# Patient Record
Sex: Female | Born: 1935 | Race: White | Hispanic: No | Marital: Married | Smoking: Former smoker
Health system: Southern US, Community
[De-identification: ages and names within clinical notes are randomized; demographics above are authoritative.]

## PROBLEM LIST (undated history)

## (undated) DIAGNOSIS — G459 Transient cerebral ischemic attack, unspecified: Secondary | ICD-10-CM

## (undated) DIAGNOSIS — R251 Tremor, unspecified: Secondary | ICD-10-CM

## (undated) HISTORY — DX: Transient cerebral ischemic attack, unspecified: G45.9

## (undated) HISTORY — DX: Tremor, unspecified: R25.1

## (undated) HISTORY — PX: BACK SURGERY: SHX140

---

## 1965-08-10 HISTORY — PX: APPENDECTOMY: SHX54

## 1969-08-10 HISTORY — PX: VESICOVAGINAL FISTULA CLOSURE W/ TAH: SUR271

## 1993-08-10 DIAGNOSIS — G459 Transient cerebral ischemic attack, unspecified: Secondary | ICD-10-CM

## 1993-08-10 HISTORY — DX: Transient cerebral ischemic attack, unspecified: G45.9

## 1997-12-14 ENCOUNTER — Other Ambulatory Visit: Admission: RE | Admit: 1997-12-14 | Discharge: 1997-12-14 | Payer: Self-pay | Admitting: Family Medicine

## 1997-12-21 ENCOUNTER — Other Ambulatory Visit: Admission: RE | Admit: 1997-12-21 | Discharge: 1997-12-21 | Payer: Self-pay | Admitting: Family Medicine

## 1998-10-23 ENCOUNTER — Ambulatory Visit (HOSPITAL_COMMUNITY): Admission: RE | Admit: 1998-10-23 | Discharge: 1998-10-23 | Payer: Self-pay | Admitting: Specialist

## 1999-07-07 ENCOUNTER — Ambulatory Visit (HOSPITAL_COMMUNITY): Admission: RE | Admit: 1999-07-07 | Discharge: 1999-07-07 | Payer: Self-pay | Admitting: Specialist

## 2000-10-27 ENCOUNTER — Other Ambulatory Visit: Admission: RE | Admit: 2000-10-27 | Discharge: 2000-10-27 | Payer: Self-pay | Admitting: Family Medicine

## 2001-07-12 ENCOUNTER — Emergency Department (HOSPITAL_COMMUNITY): Admission: EM | Admit: 2001-07-12 | Discharge: 2001-07-12 | Payer: Self-pay | Admitting: Emergency Medicine

## 2001-09-16 ENCOUNTER — Encounter: Admission: RE | Admit: 2001-09-16 | Discharge: 2001-09-16 | Payer: Self-pay | Admitting: Family Medicine

## 2001-09-16 ENCOUNTER — Encounter: Payer: Self-pay | Admitting: Family Medicine

## 2002-10-19 ENCOUNTER — Encounter: Admission: RE | Admit: 2002-10-19 | Discharge: 2002-10-19 | Payer: Self-pay | Admitting: Family Medicine

## 2002-10-19 ENCOUNTER — Encounter: Payer: Self-pay | Admitting: Family Medicine

## 2003-06-19 ENCOUNTER — Encounter: Admission: RE | Admit: 2003-06-19 | Discharge: 2003-06-19 | Payer: Self-pay | Admitting: Orthopedic Surgery

## 2003-06-20 ENCOUNTER — Ambulatory Visit (HOSPITAL_COMMUNITY): Admission: RE | Admit: 2003-06-20 | Discharge: 2003-06-20 | Payer: Self-pay | Admitting: Orthopedic Surgery

## 2003-06-20 ENCOUNTER — Ambulatory Visit (HOSPITAL_BASED_OUTPATIENT_CLINIC_OR_DEPARTMENT_OTHER): Admission: RE | Admit: 2003-06-20 | Discharge: 2003-06-20 | Payer: Self-pay | Admitting: Orthopedic Surgery

## 2003-10-20 ENCOUNTER — Encounter: Admission: RE | Admit: 2003-10-20 | Discharge: 2003-10-20 | Payer: Self-pay | Admitting: Family Medicine

## 2003-10-24 ENCOUNTER — Encounter: Admission: RE | Admit: 2003-10-24 | Discharge: 2003-10-24 | Payer: Self-pay | Admitting: Family Medicine

## 2004-07-18 ENCOUNTER — Ambulatory Visit: Payer: Self-pay | Admitting: Internal Medicine

## 2004-07-21 ENCOUNTER — Encounter: Admission: RE | Admit: 2004-07-21 | Discharge: 2004-07-21 | Payer: Self-pay | Admitting: Orthopedic Surgery

## 2004-07-23 ENCOUNTER — Ambulatory Visit (HOSPITAL_BASED_OUTPATIENT_CLINIC_OR_DEPARTMENT_OTHER): Admission: RE | Admit: 2004-07-23 | Discharge: 2004-07-23 | Payer: Self-pay | Admitting: Orthopedic Surgery

## 2004-09-11 ENCOUNTER — Ambulatory Visit: Payer: Self-pay | Admitting: Family Medicine

## 2004-10-08 ENCOUNTER — Ambulatory Visit: Payer: Self-pay | Admitting: Family Medicine

## 2004-10-09 ENCOUNTER — Ambulatory Visit: Payer: Self-pay | Admitting: Family Medicine

## 2005-06-01 ENCOUNTER — Ambulatory Visit: Payer: Self-pay | Admitting: Internal Medicine

## 2005-07-29 ENCOUNTER — Ambulatory Visit: Payer: Self-pay | Admitting: Family Medicine

## 2005-10-28 ENCOUNTER — Ambulatory Visit: Payer: Self-pay | Admitting: Family Medicine

## 2005-11-02 ENCOUNTER — Ambulatory Visit: Payer: Self-pay | Admitting: Internal Medicine

## 2005-11-11 ENCOUNTER — Ambulatory Visit: Payer: Self-pay | Admitting: Family Medicine

## 2006-05-04 ENCOUNTER — Ambulatory Visit: Payer: Self-pay | Admitting: Family Medicine

## 2006-05-20 ENCOUNTER — Ambulatory Visit: Payer: Self-pay | Admitting: Family Medicine

## 2006-05-20 LAB — CONVERTED CEMR LAB
Eosinophil percent: 1.4 % (ref 0.0–5.0)
Glucose, Bld: 92 mg/dL (ref 70–99)
HCT: 41.1 % (ref 36.0–46.0)
Hemoglobin: 13.8 g/dL (ref 12.0–15.0)
MCHC: 33.5 g/dL (ref 30.0–36.0)
MCV: 89.8 fL (ref 78.0–100.0)
Monocytes Absolute: 1.3 10*3/uL — ABNORMAL HIGH (ref 0.2–0.7)
Neutrophils Relative %: 73.3 % (ref 43.0–77.0)
RBC: 4.57 M/uL (ref 3.87–5.11)
RDW: 12.8 % (ref 11.5–14.6)
WBC: 13.1 10*3/uL — ABNORMAL HIGH (ref 4.5–10.5)

## 2007-08-16 ENCOUNTER — Ambulatory Visit: Payer: Self-pay | Admitting: Family Medicine

## 2007-08-16 DIAGNOSIS — R413 Other amnesia: Secondary | ICD-10-CM | POA: Insufficient documentation

## 2007-08-16 DIAGNOSIS — M949 Disorder of cartilage, unspecified: Secondary | ICD-10-CM

## 2007-08-16 DIAGNOSIS — E039 Hypothyroidism, unspecified: Secondary | ICD-10-CM | POA: Insufficient documentation

## 2007-08-16 DIAGNOSIS — R519 Headache, unspecified: Secondary | ICD-10-CM | POA: Insufficient documentation

## 2007-08-16 DIAGNOSIS — R51 Headache: Secondary | ICD-10-CM

## 2007-08-16 DIAGNOSIS — M899 Disorder of bone, unspecified: Secondary | ICD-10-CM | POA: Insufficient documentation

## 2007-08-25 ENCOUNTER — Telehealth: Payer: Self-pay | Admitting: Family Medicine

## 2007-08-25 ENCOUNTER — Ambulatory Visit: Payer: Self-pay | Admitting: Family Medicine

## 2007-08-29 ENCOUNTER — Encounter: Admission: RE | Admit: 2007-08-29 | Discharge: 2007-08-29 | Payer: Self-pay | Admitting: Family Medicine

## 2007-08-29 ENCOUNTER — Encounter: Payer: Self-pay | Admitting: Family Medicine

## 2007-09-02 ENCOUNTER — Encounter: Admission: RE | Admit: 2007-09-02 | Discharge: 2007-09-02 | Payer: Self-pay | Admitting: Family Medicine

## 2007-09-02 ENCOUNTER — Encounter: Payer: Self-pay | Admitting: Family Medicine

## 2007-09-13 ENCOUNTER — Telehealth: Payer: Self-pay | Admitting: Family Medicine

## 2007-09-19 ENCOUNTER — Ambulatory Visit: Payer: Self-pay | Admitting: Internal Medicine

## 2007-09-19 ENCOUNTER — Encounter: Payer: Self-pay | Admitting: Family Medicine

## 2007-09-28 ENCOUNTER — Encounter: Admission: RE | Admit: 2007-09-28 | Discharge: 2007-09-28 | Payer: Self-pay | Admitting: Neurology

## 2008-02-15 ENCOUNTER — Ambulatory Visit: Payer: Self-pay | Admitting: Family Medicine

## 2008-02-15 DIAGNOSIS — N951 Menopausal and female climacteric states: Secondary | ICD-10-CM | POA: Insufficient documentation

## 2008-02-15 DIAGNOSIS — I679 Cerebrovascular disease, unspecified: Secondary | ICD-10-CM

## 2008-03-14 ENCOUNTER — Ambulatory Visit: Payer: Self-pay | Admitting: Family Medicine

## 2008-04-26 ENCOUNTER — Telehealth: Payer: Self-pay | Admitting: Family Medicine

## 2008-05-04 ENCOUNTER — Telehealth: Payer: Self-pay | Admitting: Family Medicine

## 2008-05-08 ENCOUNTER — Ambulatory Visit: Payer: Self-pay | Admitting: Family Medicine

## 2008-08-01 ENCOUNTER — Telehealth: Payer: Self-pay | Admitting: Family Medicine

## 2008-10-25 ENCOUNTER — Ambulatory Visit: Payer: Self-pay | Admitting: Family Medicine

## 2008-10-25 ENCOUNTER — Encounter: Payer: Self-pay | Admitting: Family Medicine

## 2008-10-25 DIAGNOSIS — E785 Hyperlipidemia, unspecified: Secondary | ICD-10-CM | POA: Insufficient documentation

## 2008-10-25 LAB — CONVERTED CEMR LAB
Bilirubin Urine: NEGATIVE
Blood in Urine, dipstick: NEGATIVE
Glucose, Urine, Semiquant: NEGATIVE
Urobilinogen, UA: 0.2
WBC Urine, dipstick: NEGATIVE
pH: 5.5

## 2008-11-05 ENCOUNTER — Telehealth: Payer: Self-pay | Admitting: Family Medicine

## 2008-11-07 ENCOUNTER — Emergency Department (HOSPITAL_COMMUNITY): Admission: EM | Admit: 2008-11-07 | Discharge: 2008-11-07 | Payer: Self-pay | Admitting: Family Medicine

## 2008-11-07 LAB — CONVERTED CEMR LAB
ALT: 13 U/L
AST: 24 U/L
Albumin: 3.9 g/dL
Alkaline Phosphatase: 49 U/L
BUN: 23 mg/dL
Basophils Absolute: 0 K/uL
Basophils Relative: 0.6 %
Bilirubin, Direct: 0 mg/dL
CO2: 31 meq/L
Calcium: 8.9 mg/dL
Chloride: 105 meq/L
Cholesterol: 191 mg/dL
Creatinine, Ser: 1 mg/dL
Eosinophils Absolute: 0.1 K/uL
Eosinophils Relative: 2.3 %
GFR calc non Af Amer: 56.8 mL/min
Glucose, Bld: 93 mg/dL
HCT: 41.2 %
HDL: 77.9 mg/dL
Hemoglobin: 13.7 g/dL
LDL Cholesterol: 99 mg/dL
Lymphocytes Relative: 28.8 %
Lymphs Abs: 1.8 K/uL
MCHC: 33.3 g/dL
MCV: 89.9 fL
Monocytes Absolute: 0.7 K/uL
Monocytes Relative: 11.5 %
Neutro Abs: 3.8 K/uL
Neutrophils Relative %: 56.8 %
Platelets: 211 K/uL
Potassium: 3.7 meq/L
RBC: 4.59 M/uL
RDW: 12.9 %
Sodium: 143 meq/L
TSH: 1.5 u[IU]/mL
Total Bilirubin: 0.7 mg/dL
Total CHOL/HDL Ratio: 2
Total Protein: 6.9 g/dL
Triglycerides: 70 mg/dL
VLDL: 14 mg/dL
WBC: 6.4 10*3/microliter

## 2008-11-08 ENCOUNTER — Ambulatory Visit: Payer: Self-pay | Admitting: Family Medicine

## 2009-05-27 ENCOUNTER — Ambulatory Visit: Payer: Self-pay | Admitting: Internal Medicine

## 2009-05-27 DIAGNOSIS — IMO0002 Reserved for concepts with insufficient information to code with codable children: Secondary | ICD-10-CM | POA: Insufficient documentation

## 2009-06-20 ENCOUNTER — Telehealth: Payer: Self-pay | Admitting: Family Medicine

## 2009-07-16 ENCOUNTER — Ambulatory Visit: Payer: Self-pay | Admitting: Family Medicine

## 2009-09-10 ENCOUNTER — Telehealth: Payer: Self-pay | Admitting: Family Medicine

## 2009-09-12 ENCOUNTER — Ambulatory Visit: Payer: Self-pay | Admitting: Family Medicine

## 2009-10-29 ENCOUNTER — Ambulatory Visit: Payer: Self-pay | Admitting: Family Medicine

## 2009-10-29 DIAGNOSIS — T50995A Adverse effect of other drugs, medicaments and biological substances, initial encounter: Secondary | ICD-10-CM

## 2009-11-21 ENCOUNTER — Encounter: Payer: Self-pay | Admitting: Family Medicine

## 2009-11-21 ENCOUNTER — Ambulatory Visit: Payer: Self-pay | Admitting: Family Medicine

## 2009-12-03 ENCOUNTER — Encounter: Admission: RE | Admit: 2009-12-03 | Discharge: 2009-12-03 | Payer: Self-pay | Admitting: Family Medicine

## 2009-12-10 ENCOUNTER — Telehealth: Payer: Self-pay | Admitting: Family Medicine

## 2009-12-10 ENCOUNTER — Encounter: Payer: Self-pay | Admitting: Family Medicine

## 2010-01-03 ENCOUNTER — Telehealth: Payer: Self-pay | Admitting: Family Medicine

## 2010-06-10 ENCOUNTER — Ambulatory Visit: Payer: Self-pay | Admitting: Family Medicine

## 2010-06-18 ENCOUNTER — Ambulatory Visit: Payer: Self-pay | Admitting: Family Medicine

## 2010-06-18 DIAGNOSIS — B0229 Other postherpetic nervous system involvement: Secondary | ICD-10-CM

## 2010-07-02 ENCOUNTER — Ambulatory Visit: Payer: Self-pay | Admitting: Family Medicine

## 2010-07-10 ENCOUNTER — Ambulatory Visit: Payer: Self-pay | Admitting: Internal Medicine

## 2010-08-19 ENCOUNTER — Ambulatory Visit
Admission: RE | Admit: 2010-08-19 | Discharge: 2010-08-19 | Payer: Self-pay | Source: Home / Self Care | Attending: Family Medicine | Admitting: Family Medicine

## 2010-08-19 DIAGNOSIS — H3530 Unspecified macular degeneration: Secondary | ICD-10-CM | POA: Insufficient documentation

## 2010-09-07 LAB — CONVERTED CEMR LAB
ALT: 14 units/L (ref 0–35)
AST: 19 units/L (ref 0–37)
Alkaline Phosphatase: 65 units/L (ref 39–117)
BUN: 16 mg/dL (ref 6–23)
BUN: 18 mg/dL (ref 6–23)
Basophils Absolute: 0.1 10*3/uL (ref 0.0–0.1)
Basophils Relative: 0.1 % (ref 0.0–1.0)
Bilirubin Urine: NEGATIVE
Bilirubin, Direct: 0.1 mg/dL (ref 0.0–0.3)
Bilirubin, Direct: 0.1 mg/dL (ref 0.0–0.3)
Blood in Urine, dipstick: NEGATIVE
CO2: 29 meq/L (ref 19–32)
CO2: 33 meq/L — ABNORMAL HIGH (ref 19–32)
Calcium: 8.8 mg/dL (ref 8.4–10.5)
Chloride: 102 meq/L (ref 96–112)
Chloride: 104 meq/L (ref 96–112)
Cholesterol: 188 mg/dL (ref 0–200)
Creatinine, Ser: 0.8 mg/dL (ref 0.4–1.2)
Eosinophils Absolute: 0.1 10*3/uL (ref 0.0–0.7)
Eosinophils Relative: 1.2 % (ref 0.0–5.0)
GFR calc Af Amer: 89 mL/min
Glucose, Bld: 96 mg/dL (ref 70–99)
HDL: 69.3 mg/dL (ref >39.0)
Ketones, urine, test strip: NEGATIVE
Ketones, urine, test strip: NEGATIVE
LDL Cholesterol: 85 mg/dL (ref 0–99)
LDL Cholesterol: 96 mg/dL (ref 0–99)
Lymphocytes Relative: 22.9 % (ref 12.0–46.0)
MCHC: 33.2 g/dL (ref 30.0–36.0)
MCV: 90.5 fL (ref 78.0–100.0)
Monocytes Absolute: 0.5 10*3/uL (ref 0.1–1.0)
Monocytes Relative: 7.7 % (ref 3.0–11.0)
Neutro Abs: 5.8 10*3/uL (ref 1.4–7.7)
Neutrophils Relative %: 58.4 % (ref 43.0–77.0)
Nitrite: NEGATIVE
Platelets: 244 10*3/uL (ref 150.0–400.0)
Platelets: 284 10*3/uL (ref 150–400)
Protein, U semiquant: NEGATIVE
RBC: 4.53 M/uL (ref 3.87–5.11)
RDW: 13.9 % (ref 11.5–14.6)
Specific Gravity, Urine: 1.025
Total Bilirubin: 0.6 mg/dL (ref 0.3–1.2)
Total CHOL/HDL Ratio: 2.4
Total Protein: 6.2 g/dL (ref 6.0–8.3)
Triglycerides: 75 mg/dL (ref 0–149)
Triglycerides: 86 mg/dL (ref 0.0–149.0)
Urobilinogen, UA: 0.2
VLDL: 15 mg/dL (ref 0–40)
Vit D, 25-Hydroxy: 27 ng/mL — ABNORMAL LOW (ref 30–89)
WBC Urine, dipstick: NEGATIVE
WBC: 6.8 10*3/uL (ref 4.5–10.5)
WBC: 8.4 10*3/uL (ref 4.5–10.5)
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
## 2010-09-09 NOTE — Progress Notes (Signed)
Summary: REFILL REQUEST (Diazepam)  Phone Note Refill Request Message from:  Fax from Pharmacy on Jan 03, 2010 10:39 AM  Refills Requested: Medication #1:  DIAZEPAM 2 MG  TABS 1 by mouth three times a day  as needed   Notes: Qty-270 EA......[facility] [address] [place].    Initial call taken by: Debbra Riding,  Jan 03, 2010 10:40 AM    Prescriptions: DIAZEPAM 2 MG  TABS (DIAZEPAM) 1 by mouth three times a day  as needed  #270 x 1   Entered by:   Duard Brady LPN   Authorized by:   Judithann Sheen MD   Signed by:   Duard Brady LPN on 34/74/2595   Method used:   Historical   RxID:   [id number]
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
## 2010-09-09 NOTE — Letter (Signed)
Summary: Generic Letter  [facility]  [address] [place], [place] [address]   Phone: [phone]  Fax: [phone]    12/10/2009  Christina Peck [address] [place], [place]  [address]  Dear Ms. Galvan,   We have been unable to reach you via phone. Dr Scotty Court has reviewed your Bone Denisty report which is normal. Continue calcium with vitamin D twice a day.            Sincerely,   Dr Gwenyth Bender Stafford,MD

## 2010-09-09 NOTE — Miscellaneous (Signed)
Summary: BONE DENSITY  Clinical Lists Changes  Orders: Added new Test order of T-Bone Densitometry (77080) - Signed Added new Test order of T-Lumbar Vertebral Assessment (77082) - Signed 

## 2010-09-09 NOTE — Progress Notes (Signed)
Summary: Christina Peck  Phone Note Call from Patient   Summary of Call: The Vit D 50,000 units or 1.25mg  D2 causes constipation & something else she can't remember.  Then said to forget this.  What she really needs is help with Christina Peck.   Christina Peck went to heart specialist & he said stents okay.  She thinks needs to go to psychiatrist, he's going downhill, not eating, lost 10 lbs, always something, yesterday he siad his thumb was burning, & she thinks meds too muc or too many.  She needs Almira Coaster to call her back.  Wouldn't let me transfer her because she didn't want Christina Peck to hear and said he was coming right back into room.    DOB [birth year].   Almira Coaster called wife.  See message Margaretmary Eddy [birth year] Rudy Jew, RN  Dec 10, 2009 2:42 PM   Initial call taken by: Rudy Jew, RN,  Dec 10, 2009 11:45 AM
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
## 2010-09-09 NOTE — Progress Notes (Signed)
Summary: arm sore from shot & needs premarin & diazepam  Phone Note Call from Patient Call back at Home Phone [phone]   Caller: vm Call For: stafford Reason for Call: Talk to Nurse Summary of Call: 1)Arm still sore after about a month from tetanus shot when she lifts arm or touches it where the shot was given.  No redness or sore or anything visible.  No lump when she feels area.  She is using the arm plenty.  Nurse advised warm wet washcloth to area 4x daily for comfort or ov as needed.  She declined it today.  Has appointment Mar or will call back if still sore to get it checked.   2) Her meds are no longer mailorder.  They sent her a list that had premarin 0.625mg  once daily  on it to get at local store.  Diazepam 2mg  tid was not on it, but she needs it too.  [facility]. Initial call taken by: Rudy Jew, RN,  September 10, 2009 10:02 AM  Follow-up for Phone Call        called pt to come in, also took care of prescriptions
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
## 2010-09-20 ENCOUNTER — Other Ambulatory Visit: Payer: Self-pay | Admitting: Family Medicine

## 2010-09-20 DIAGNOSIS — F419 Anxiety disorder, unspecified: Secondary | ICD-10-CM

## 2010-09-25 ENCOUNTER — Other Ambulatory Visit: Payer: Self-pay | Admitting: *Deleted

## 2010-09-25 ENCOUNTER — Telehealth: Payer: Self-pay | Admitting: *Deleted

## 2010-09-25 MED ORDER — DIAZEPAM 5 MG PO TABS: 5.0000 mg | ORAL_TABLET | Freq: Three times a day (TID) | ORAL | Status: DC | PRN

## 2010-09-25 NOTE — Telephone Encounter (Signed)
ERROR in opening.

## 2010-09-25 NOTE — Telephone Encounter (Signed)
Pt calls stating that she usually gets Diazepam 2 mg. One po tid for 3 months, and not the Diazepam 5 mg. q 8 hours.  Please call her.

## 2010-09-26 ENCOUNTER — Telehealth: Payer: Self-pay | Admitting: Family Medicine

## 2010-09-26 NOTE — Telephone Encounter (Signed)
Changed to 2 mg diazepam

## 2010-09-26 NOTE — Telephone Encounter (Signed)
[facility] [address] and was given Diazepam 5 mg take 1 pill every 8 hrs anxiety or sleep. Pt says that dosage and instructions are wrong. It should be 2 mg 1 tab tid as needed for stress. Pls call pharmacy and corrected.

## 2010-09-29 ENCOUNTER — Other Ambulatory Visit: Payer: Self-pay | Admitting: *Deleted

## 2010-09-29 NOTE — Telephone Encounter (Signed)
rx changed

## 2010-10-07 ENCOUNTER — Encounter: Payer: Self-pay | Admitting: Internal Medicine

## 2010-10-07 ENCOUNTER — Ambulatory Visit (INDEPENDENT_AMBULATORY_CARE_PROVIDER_SITE_OTHER): Payer: Medicare Other | Admitting: Internal Medicine

## 2010-10-07 ENCOUNTER — Ambulatory Visit: Payer: Self-pay | Admitting: Internal Medicine

## 2010-10-07 DIAGNOSIS — J40 Bronchitis, not specified as acute or chronic: Secondary | ICD-10-CM | POA: Insufficient documentation

## 2010-10-07 MED ORDER — AZITHROMYCIN 250 MG PO TABS: ORAL_TABLET | ORAL | Status: AC

## 2010-10-07 NOTE — Assessment & Plan Note (Signed)
Recommend abx tx. Followup if no improvement or worsening.

## 2010-10-07 NOTE — Progress Notes (Signed)
  Subjective:    Patient ID: Christina Peck, female    DOB: 11-30-1926, 77 y.o.   MRN: [id number]  HPI  Pt presents to clinic for evaluation of cough. Notes 8 day h/o NP cough, recent fever with Tmax 101 and nasal congestion. No known sick exposure. Has attempted hydromet cough syrup without significant improvement. No other alleviating or exacerbating factors. +fatigue and malaise.   Reviewed PMH, medications and allergies    Review of Systems  Constitutional: Positive for fever, chills and fatigue.  HENT: Positive for congestion and sneezing. Negative for ear pain and facial swelling.   Eyes: Negative for pain, discharge and redness.  Respiratory: Positive for cough. Negative for shortness of breath.   Skin: Negative for color change, pallor and rash.       Objective:   Physical Exam  Nursing note and vitals reviewed. Constitutional: She appears well-developed and well-nourished. No distress.  HENT:  Head: Normocephalic and atraumatic.  Right Ear: Tympanic membrane, external ear and ear canal normal.  Left Ear: Tympanic membrane, external ear and ear canal normal.  Nose: Nose normal.  Mouth/Throat: Oropharynx is clear and moist. No oropharyngeal exudate.  Eyes: Conjunctivae are normal. No scleral icterus.  Neck: Neck supple.  Cardiovascular: Normal rate, regular rhythm and normal heart sounds.   Pulmonary/Chest: Effort normal and breath sounds normal. No respiratory distress. She has no wheezes. She has no rales.  Lymphadenopathy:    She has no cervical adenopathy.  Neurological: She is alert.  Skin: Skin is warm and dry. No rash noted. She is not diaphoretic. No erythema.          Assessment & Plan:

## 2010-10-13 ENCOUNTER — Telehealth: Payer: Self-pay | Admitting: *Deleted

## 2010-10-13 MED ORDER — AZITHROMYCIN 250 MG PO TABS: ORAL_TABLET | ORAL | Status: DC

## 2010-10-13 NOTE — Telephone Encounter (Signed)
Repeat zpak as directed #1 but followup if no signficant improvement or worsening

## 2010-10-13 NOTE — Telephone Encounter (Signed)
Notified pt and meds called to pharmacy.

## 2010-10-13 NOTE — Telephone Encounter (Signed)
Finished antibiotic Saturday, no appetite , extremely fatigued.  Lower  back pain. Coughing is better. Finished the cough meds, and the cough is back only when she talks.

## 2010-10-16 ENCOUNTER — Other Ambulatory Visit: Payer: Self-pay

## 2010-10-16 MED ORDER — AZITHROMYCIN 250 MG PO TABS: ORAL_TABLET | ORAL | Status: AC

## 2010-10-16 MED ORDER — AZITHROMYCIN 250 MG PO TABS: ORAL_TABLET | ORAL | Status: DC

## 2010-10-28 ENCOUNTER — Telehealth: Payer: Self-pay | Admitting: Family Medicine

## 2010-10-28 NOTE — Telephone Encounter (Signed)
Pt is sadly req to changed pcp from Dr Scotty Court to Dr Rodena Medin, due to availablity issues. Pt is req to sch a cpx with Dr Rodena Medin in March. Pls let pt know if it is ok to change doctors.

## 2010-10-29 NOTE — Telephone Encounter (Signed)
ok 

## 2010-10-30 NOTE — Telephone Encounter (Signed)
Pt is req to change pcp to see Dr Rodena Medin. Dr Rodena Medin has accepted req. Pls advise.

## 2010-11-06 NOTE — Telephone Encounter (Signed)
OK to cange

## 2010-11-12 ENCOUNTER — Encounter: Payer: Self-pay | Admitting: Family Medicine

## 2010-11-12 ENCOUNTER — Ambulatory Visit (INDEPENDENT_AMBULATORY_CARE_PROVIDER_SITE_OTHER): Payer: Medicare Other | Admitting: Family Medicine

## 2010-11-12 VITALS — BP 98/68 | Temp 98.1°F

## 2010-11-12 DIAGNOSIS — R05 Cough: Secondary | ICD-10-CM

## 2010-11-12 DIAGNOSIS — R059 Cough, unspecified: Secondary | ICD-10-CM

## 2010-11-12 MED ORDER — HYDROCODONE-HOMATROPINE 5-1.5 MG/5ML PO SYRP: 5.0000 mL | ORAL_SOLUTION | Freq: Four times a day (QID) | ORAL | Status: AC | PRN

## 2010-11-12 NOTE — Progress Notes (Signed)
  Subjective:    Patient ID: Christina Peck, female    DOB: Jun 11, 1927, 77 y.o.   MRN: [id number]  HPI Patient seen with cough. Remote history of smoking many years ago. Recurrent cough for the past several months but each time has improved between episodes. Current cough 5 days duration. She reports temperature of 99.8 but no chills. Increased fatigue. Denies any dyspnea, hemoptysis, or pleuritic pain. Questionable remote history of TB nonpulmonary treated many years ago.  Hycodan cough syrup helps night cough but running out.   Review of Systems  Constitutional: Negative for activity change, appetite change and unexpected weight change.  Respiratory: Positive for cough. Negative for chest tightness, shortness of breath and wheezing.   Cardiovascular: Negative for chest pain and leg swelling.  Gastrointestinal: Negative for abdominal pain.  Neurological: Negative for syncope.  Hematological: Negative for adenopathy. Does not bruise/bleed easily.       Objective:   Physical Exam  Constitutional: She appears well-developed and well-nourished.  HENT:  Mouth/Throat: Oropharynx is clear and moist. No oropharyngeal exudate.  Neck: Neck supple.  Cardiovascular: Normal rate and regular rhythm.  Exam reveals no gallop.   Pulmonary/Chest: Effort normal and breath sounds normal. No respiratory distress. She has no wheezes. She has no rales.  Musculoskeletal: She exhibits no edema.  Lymphadenopathy:    She has no cervical adenopathy.  Skin: No rash noted.          Assessment & Plan:  Cough. She has no fever this time and lung exam unremarkable. Questionable viral bronchitis. Refilled Hycodan cough syrup for suppression. Followup when necessary if symptoms worsen or persist

## 2010-11-12 NOTE — Patient Instructions (Signed)
Follow up promptly for any increased fever, shortness of breath, coughing up blood, or any pain with deep breathing.

## 2010-11-19 ENCOUNTER — Telehealth: Payer: Self-pay | Admitting: Speech Pathology

## 2010-11-19 ENCOUNTER — Ambulatory Visit (INDEPENDENT_AMBULATORY_CARE_PROVIDER_SITE_OTHER)
Admission: RE | Admit: 2010-11-19 | Discharge: 2010-11-19 | Disposition: A | Discharge status: Home / Self Care | Payer: Medicare Other | Source: Ambulatory Visit | Attending: Internal Medicine | Admitting: Internal Medicine

## 2010-11-19 ENCOUNTER — Encounter: Payer: Self-pay | Admitting: Internal Medicine

## 2010-11-19 ENCOUNTER — Ambulatory Visit (INDEPENDENT_AMBULATORY_CARE_PROVIDER_SITE_OTHER): Payer: Medicare Other | Admitting: Internal Medicine

## 2010-11-19 DIAGNOSIS — R05 Cough: Secondary | ICD-10-CM

## 2010-11-19 DIAGNOSIS — R011 Cardiac murmur, unspecified: Secondary | ICD-10-CM

## 2010-11-19 DIAGNOSIS — E559 Vitamin D deficiency, unspecified: Secondary | ICD-10-CM

## 2010-11-19 DIAGNOSIS — R5383 Other fatigue: Secondary | ICD-10-CM

## 2010-11-19 DIAGNOSIS — R5381 Other malaise: Secondary | ICD-10-CM

## 2010-11-19 DIAGNOSIS — R059 Cough, unspecified: Secondary | ICD-10-CM

## 2010-11-19 LAB — HEPATIC FUNCTION PANEL
ALT: 12 U/L (ref 0–35)
Albumin: 3.4 g/dL — ABNORMAL LOW (ref 3.5–5.2)
Alkaline Phosphatase: 72 U/L (ref 39–117)
Total Protein: 6.4 g/dL (ref 6.0–8.3)

## 2010-11-19 LAB — CBC WITH DIFFERENTIAL/PLATELET
Eosinophils Relative: 1.2 % (ref 0.0–5.0)
HCT: 38.7 % (ref 36.0–46.0)
Hemoglobin: 13.1 g/dL (ref 12.0–15.0)
Lymphs Abs: 1.8 10*3/uL (ref 0.7–4.0)
Monocytes Relative: 7.9 % (ref 3.0–12.0)
Neutro Abs: 5.6 10*3/uL (ref 1.4–7.7)
Platelets: 300 10*3/uL (ref 150.0–400.0)
WBC: 8.2 10*3/uL (ref 4.5–10.5)

## 2010-11-19 LAB — BASIC METABOLIC PANEL
CO2: 29 mEq/L (ref 19–32)
Calcium: 8.6 mg/dL (ref 8.4–10.5)
Chloride: 101 mEq/L (ref 96–112)
Glucose, Bld: 84 mg/dL (ref 70–99)
Sodium: 139 mEq/L (ref 135–145)

## 2010-11-19 LAB — TSH: TSH: 0.82 u[IU]/mL (ref 0.35–5.50)

## 2010-11-19 MED ORDER — LEVOFLOXACIN 500 MG PO TABS: 500.0000 mg | ORAL_TABLET | Freq: Every day | ORAL | Status: AC

## 2010-11-19 NOTE — Telephone Encounter (Signed)
Opened in error

## 2010-11-20 ENCOUNTER — Telehealth: Payer: Self-pay | Admitting: Family Medicine

## 2010-11-20 NOTE — Telephone Encounter (Signed)
Discussed results with husband.cxr and labs nl. Want to see how she does on this abx. If at end of abx she is not significantly better then will consider pulmonary referral. Would hold off on cpe and keep regular appts. This would not impact her ability to get cpe's in the future.

## 2010-11-20 NOTE — Telephone Encounter (Signed)
Pt is sch for fasting cpx on 11/25/10 and is wanting to know if she needs to keep this appt, or wait to see what the labs and chest xray reveals. Pt also wants to know if she doesn't come in for cpx, if that would have a negative effect on her yearly cpx for next year? Pls adivse.

## 2010-11-20 NOTE — Telephone Encounter (Signed)
Pt unavailable. Spoke with husband. He will give her the message again and have her call back if necessary

## 2010-11-23 ENCOUNTER — Encounter: Payer: Self-pay | Admitting: Internal Medicine

## 2010-11-23 DIAGNOSIS — E559 Vitamin D deficiency, unspecified: Secondary | ICD-10-CM | POA: Insufficient documentation

## 2010-11-23 DIAGNOSIS — R011 Cardiac murmur, unspecified: Secondary | ICD-10-CM | POA: Insufficient documentation

## 2010-11-23 DIAGNOSIS — R5383 Other fatigue: Secondary | ICD-10-CM | POA: Insufficient documentation

## 2010-11-23 DIAGNOSIS — R059 Cough, unspecified: Secondary | ICD-10-CM | POA: Insufficient documentation

## 2010-11-23 DIAGNOSIS — R05 Cough: Secondary | ICD-10-CM | POA: Insufficient documentation

## 2010-11-23 NOTE — Assessment & Plan Note (Signed)
?   Diastolic murmur on exam. Schedule 2d echo.

## 2010-11-23 NOTE — Assessment & Plan Note (Signed)
Persistent cough. Reattempt abx course. Obtain CXR. If sx persist consider pulmonary consult

## 2010-11-23 NOTE — Assessment & Plan Note (Signed)
Obtain vitamin d level 

## 2010-11-23 NOTE — Progress Notes (Signed)
  Subjective:    Patient ID: Christina Peck, female    DOB: 01-Mar-1927, 78 y.o.   MRN: [id number]  HPI Pt presents to clinic for evaluation of fatigue. Notes chronic fatigue recently worse with associated NP cough. Had fevers 4/5-4/8 with temperatures 101-102 now resolved. + weakness, decreased appetite. Mucinex helps cough. 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 Chart review indicates h/o anemia and vit d deficiency in the past as well. No alleviating or exacerbating factors.  Reviewed pmh, medications and allergies.    Review of Systems  Constitutional: Positive for fever, appetite change and fatigue. Negative for chills.  HENT: Negative for neck stiffness.   Respiratory: Positive for cough. Negative for shortness of breath and wheezing.   Cardiovascular: Negative for chest pain.  Neurological: Negative for seizures and facial asymmetry.  Psychiatric/Behavioral: Negative for confusion and agitation.       Objective:   Physical Exam  Nursing note and vitals reviewed. Constitutional: She appears well-developed and well-nourished. No distress.  HENT:  Head: Normocephalic.  Right Ear: External ear normal.  Left Ear: External ear normal.  Eyes: Conjunctivae are normal. No scleral icterus.  Neck: Neck supple. No JVD present. No thyromegaly present.  Cardiovascular: Normal rate and regular rhythm.  Exam reveals no gallop and no friction rub.   Murmur heard.      ?diastolic murmur  Pulmonary/Chest: Effort normal and breath sounds normal. No respiratory distress. She has no wheezes. She has no rales.  Lymphadenopathy:    She has no cervical adenopathy.  Neurological: She is alert.  Skin: Skin is warm and dry. She is not diaphoretic.  Psychiatric: She has a normal mood and affect.          Assessment & Plan:

## 2010-11-23 NOTE — Assessment & Plan Note (Signed)
Obtain cbc, chem7, lft, esr, b12, tsh

## 2010-11-25 ENCOUNTER — Encounter: Payer: Medicare Other | Admitting: Internal Medicine

## 2010-11-27 ENCOUNTER — Ambulatory Visit (HOSPITAL_COMMUNITY): Payer: Medicare Other | Attending: Internal Medicine

## 2010-11-27 DIAGNOSIS — R5383 Other fatigue: Secondary | ICD-10-CM | POA: Insufficient documentation

## 2010-11-27 DIAGNOSIS — I079 Rheumatic tricuspid valve disease, unspecified: Secondary | ICD-10-CM | POA: Insufficient documentation

## 2010-11-27 DIAGNOSIS — I059 Rheumatic mitral valve disease, unspecified: Secondary | ICD-10-CM | POA: Insufficient documentation

## 2010-11-27 DIAGNOSIS — R011 Cardiac murmur, unspecified: Secondary | ICD-10-CM | POA: Insufficient documentation

## 2010-11-27 DIAGNOSIS — R5381 Other malaise: Secondary | ICD-10-CM | POA: Insufficient documentation

## 2010-12-01 ENCOUNTER — Encounter: Payer: Self-pay | Admitting: Internal Medicine

## 2010-12-01 ENCOUNTER — Ambulatory Visit (INDEPENDENT_AMBULATORY_CARE_PROVIDER_SITE_OTHER): Payer: Medicare Other | Admitting: Internal Medicine

## 2010-12-01 VITALS — BP 120/50 | HR 80 | Wt 111.0 lb

## 2010-12-01 DIAGNOSIS — R05 Cough: Secondary | ICD-10-CM

## 2010-12-01 DIAGNOSIS — R053 Chronic cough: Secondary | ICD-10-CM

## 2010-12-01 DIAGNOSIS — R059 Cough, unspecified: Secondary | ICD-10-CM

## 2010-12-04 ENCOUNTER — Encounter: Payer: Self-pay | Admitting: Internal Medicine

## 2010-12-04 ENCOUNTER — Ambulatory Visit (INDEPENDENT_AMBULATORY_CARE_PROVIDER_SITE_OTHER): Payer: Medicare Other | Admitting: Internal Medicine

## 2010-12-04 VITALS — BP 126/72 | HR 62 | Temp 97.9°F | Ht 60.6 in | Wt 111.4 lb

## 2010-12-04 DIAGNOSIS — R059 Cough, unspecified: Secondary | ICD-10-CM

## 2010-12-04 DIAGNOSIS — R05 Cough: Secondary | ICD-10-CM

## 2010-12-04 NOTE — Patient Instructions (Addendum)
As long as you are coughing at all regardless of the cause start prilosec 20mg  Take 30-60 min before first meal of the day  And Pepcid 20 mg at bedtime (remember that reflux is to cough what oxygen is to fire).  GERD (REFLUX)  is an extremely common cause of respiratory symptoms, many times with no significant heartburn at all.    It can be treated with medication, but also with lifestyle changes including avoidance of late meals, excessive alcohol, smoking cessation, and avoid fatty foods, chocolate, peppermint, colas, red wine, and acidic juices such as orange juice.  NO MINT OR MENTHOL PRODUCTS SO NO COUGH DROPS  USE SUGARLESS CANDY INSTEAD (jolley ranchers or Stover's)  NO OIL BASED VITAMINS    Unlike when you get a prescription for eyeglasses, it's not possible to always walk out of this or any medical office with a perfect prescription that is immediately effective  based on any test that we offer here.    On the contrary, it may take several weeks for the full impact of changes recommened today - hopefully you will respond well.  If not, then we'll adjust your medication on your next visit accordingly, knowing more then than we can possibly know now.      If not 100% satisfied after 4 weeks, return here.  If all better, tell your friends!

## 2010-12-04 NOTE — Assessment & Plan Note (Addendum)
The most common causes of chronic cough in immunocompetent adults include the following: upper airway cough syndrome (UACS), previously referred to as postnasal drip syndrome (PNDS), which is caused by variety of rhinosinus conditions; (2) asthma; (3) GERD; (4) chronic bronchitis from cigarette smoking or other inhaled environmental irritants; (5) nonasthmatic eosinophilic bronchitis; and (6) bronchiectasis.   These conditions, singly or in combination, have accounted for up to 94% of the causes of chronic cough in prospective studies.   Other conditions have constituted no >6% of the causes in prospective studies These have included bronchogenic carcinoma, chronic interstitial pneumonia, sarcoidosis, left ventricular failure, ACEI-induced cough, and aspiration from a condition associated with pharyngeal dysfunction.   .Chronic cough is often simultaneously caused by more than one condition. A single cause has been found from 38 to 82% of the time, multiple causes from 18 to 62%. Multiply caused cough has been the result of three diseases up to 42% of the time.     This most likely  Upper airway cough syndrome, so named because it's frequently impossible to sort out how much is  CR/sinusitis with freq throat clearing (which can be related to primary GERD)   vs  causing  secondary (" extra esophageal")  GERD from wide swings in gastric pressure that occur with throat clearing, often  promoting self use of mint and menthol lozenges that reduce the lower esophageal sphincter tone and exacerbate the problem further in a cyclical fashion.   These are the same pts who not infrequently have failed to tolerate ace inhibitors,  dry powder inhalers or biphosphonates or report having reflux symptoms that don't respond to standard doses of PPI , and are easily confused as having aecopd or asthma flares,   See instructions for specific recommendations which were reviewed directly with the patient who was given a  copy with highlighter outlining the key components.
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
## 2010-12-07 ENCOUNTER — Encounter: Payer: Self-pay | Admitting: Internal Medicine

## 2010-12-07 DIAGNOSIS — R053 Chronic cough: Secondary | ICD-10-CM | POA: Insufficient documentation

## 2010-12-07 DIAGNOSIS — R05 Cough: Secondary | ICD-10-CM | POA: Insufficient documentation

## 2010-12-07 NOTE — Assessment & Plan Note (Signed)
Nonspecific symptoms with normal evaluation to date. However chronic cough remains persistent. Multiple chemical inhalation exposures in the past with associated mild pulmonary hypertension on echo. Proceed with pulmonary consult
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
## 2010-12-15 ENCOUNTER — Ambulatory Visit (INDEPENDENT_AMBULATORY_CARE_PROVIDER_SITE_OTHER): Payer: Medicare Other | Admitting: Internal Medicine

## 2010-12-15 ENCOUNTER — Encounter: Payer: Self-pay | Admitting: Internal Medicine

## 2010-12-15 DIAGNOSIS — R5381 Other malaise: Secondary | ICD-10-CM

## 2010-12-15 DIAGNOSIS — R5383 Other fatigue: Secondary | ICD-10-CM

## 2010-12-15 DIAGNOSIS — R05 Cough: Secondary | ICD-10-CM

## 2010-12-15 DIAGNOSIS — R059 Cough, unspecified: Secondary | ICD-10-CM

## 2010-12-15 NOTE — Assessment & Plan Note (Signed)
Resolved

## 2010-12-15 NOTE — Progress Notes (Signed)
  Subjective:    Patient ID: Christina Peck, female    DOB: 1927-06-07, 76 y.o.   MRN: [id number]  HPI Pt presents to clinic for followup of fatigue and chronic cough. Chronic cough has resolved without further treatment. Was evaluated by pulmonary who recommended PPI and H2 blocker but patient did not take. Patient also notes chronic history of resting tremor involving hands and legs. Specifically notes worsening with stress. When questioned about possible stress and anxiety notes multiple stressors at home including taking care of her husband and her grandson. Does feel that stress may be contributing to her physical symptoms. Chronically for years but is not interested in adjusting her medication currently. Also discussed yearly mammograms and patient currently declines. Is aware of the recommendation for yearly mammography for breast cancer screening but states if diagnosed would not be interested in treatment. No other complaints  Reviewed past medical history, medications and allergies.  Review of Systems  Constitutional: Positive for fatigue. Negative for fever and chills.  HENT: Negative for congestion.   Respiratory: Negative for cough, shortness of breath and wheezing.   Neurological: Positive for tremors.  Psychiatric/Behavioral: The patient is nervous/anxious.        Objective:   Physical Exam  [vitalsreviewed. Constitutional: She appears well-developed and well-nourished. No distress.  HENT:  Head: Normocephalic and atraumatic.  Eyes: Conjunctivae are normal. No scleral icterus.  Neurological: She is alert.       Bilateral resting fine hand tremor. Symmetric. Visibly worsened after discussing home stressors.  Skin: Skin is warm and dry. She is not diaphoretic.  Psychiatric: She has a normal mood and affect.          Assessment & Plan:

## 2010-12-15 NOTE — Assessment & Plan Note (Signed)
Workup negative to date suspect possible contribution from home stressors. Patient declines adjustment of medication. Schedule routine followup.

## 2010-12-26 NOTE — Op Note (Signed)
[place]. [facility]  Patient:    Christina Peck                         MRN: [id number] Proc. Date: 07/07/99 Adm. Date:  [id number] Attending:  Mick Sell                           Operative Report  SURGEON:  Chucky May, M.D.  PREOPERATIVE DIAGNOSIS:  Cataract, O.S.  POSTOPERATIVE DIAGNOSIS:  Cataract, O.S.  OPERATION PERFORMED:  Cataract extraction with intraocular lens implant, O.S.  INDICATIONS FOR PROCEDURE:  The patient is a 77 year old female with painless and progressive decrease in vision.  She says that she has difficulty seeing for reading.  On examination, she was found to have a dense nuclear and posterior subcapsular cataract consistent with a decrease in visual acuity.  DESCRIPTION OF PROCEDURE:  The patient was brought to the main operating room and placed in the supine position.  Anesthesia was obtained by a means of topical 4% lidocaine drops with tetracaine.  She was then prepped and draped in the usual manner.  A lid speculum was inserted, and the cornea was entered with a diamond  keratome superiorly with an additional port superior temporally in clear cornea  with a 15 degree blade.  OcuCoat was instilled, and an anterior capsulorrhexis as performed without difficulty, followed by thecal emulsification of the nucleus nd removal of residual cortical material by irrigation and aspiration.  The posterior capsule was polished, and a posterior chamber lens implant was placed in the bag without difficulty.  The OcuCoat was removed and replaced with balanced salt solution.  The wound was hydrated with a balanced salt solution and checked for  fluid leaks, and none were noted.  The eye was dressed with topical Pred Forte,  Ocuflox, Voltaren, and a Fox shield, and the patient was taken to the recovery oom in excellent condition where she received written and verbal instructions for postoperative care and was scheduled  for a followup in 24 hours. DD:  07/07/99 TD:  07/07/99 Job: [id number]

## 2010-12-26 NOTE — Op Note (Signed)
NAME:  Christina Peck, Christina Peck                           ACCOUNT NO.:  [id number]   MEDICAL RECORD NO.:  [id number]                   PATIENT TYPE:  AMB   LOCATION:  DSC                                  FACILITY:  [facility]   PHYSICIAN:  Matthew A. Mina Marble, M.D.           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   OPERATION PERFORMED:  Release of right elbow and right wrist ulnar nerve.   SURGEON:  Artist Pais. Mina Marble, M.D.   ASSISTANT:  Aura Fey. Bobbe Medico.   ANESTHESIA:  General.   TOURNIQUET TIME:  40 minutes.   COMPLICATIONS:  None.   DRAINS:  None.   DESCRIPTION OF PROCEDURE:  The patient was taken to the operating room.  After the induction of adequate general anesthesia, the right upper  extremity was prepped and draped in the usual sterile fashion.  Esmarch  bandage was used to exsanguinate the limb.  The tourniquet was inflated to  .  At this point a longitudinal incision was made over the palpable  border of the flexor carpi ulnaris tendon and then a Brunner incision was  crossed over the wrist.  Flaps were raised accordingly.  The ulnar nerve and  artery were identified proximal in the wound and decompressed to the level  of Guillain's canal and to the level of the take off of the superficial  branches of the ulnar nerve.  All compression points were released.  This  wound was thoroughly irrigated and then loosely closed with 5-0 nylon.  A  second incision was made on the inside of the elbow centered between the  medial epicondyle and the olecranon process going 2.5 cm in either  direction.  Incision was taken down through the skin and subcutaneous  tissues.  Care was taken to identify and protect  branches of the medial  antebrachial cutaneous nerve.  Once this was done, the ulnar nerve was  identified proximal to the cubital tunnel.  The cubital tunnel was released.  Ulnar nerve decompressed to the level of the flexor carpi ulnaris muscle  bellies and the fascia overlying this was released as well as proximally to  the level of the ligament of Struthers.  The medial intermuscular septum was  excised.  The wound was thoroughly irrigated.  Hemostasis was achieved with  bipolar cautery and this was closed with running 3-0 Prolene subcuticular  stitch.  Steri-Strips, 4 x 4s, fluffs and compressive dressings were applied  to both the wrist and the elbow.  The patient tolerated the procedure well  and went to recovery room in stable fashion.  Artist Pais Mina Marble, M.D.    MAW/MEDQ  D:  06/20/2003  T:  06/20/2003  Job:  [id number]

## 2010-12-26 NOTE — Op Note (Signed)
NAME:  Christina Peck, Christina Peck               ACCOUNT NO.:  [id number]   MEDICAL RECORD NO.:  [id number]          PATIENT TYPE:  AMB   LOCATION:  DSC                          FACILITY:  [facility]   PHYSICIAN:  Matthew A. Weingold, M.D.DATE OF BIRTH:  June 27, 1927   DATE OF PROCEDURE:  07/23/2004  DATE OF DISCHARGE:                                 OPERATIVE REPORT   PREOPERATIVE DIAGNOSIS:  Right elbow recurrent ulnar compression neuropathy.   POSTOPERATIVE DIAGNOSIS:  Right elbow recurrent ulnar compression  neuropathy.   PROCEDURE:  Revision ulnar neuroplasty right elbow with intramuscular  transposition of the ulnar nerve.   SURGEON:  Artist Pais. Mina Marble, M.D.   ASSISTANT:  Aura Fey. Bobbe Medico.   ANESTHESIA:  General.   TOURNIQUET TIME:  50 minutes.   COMPLICATIONS:  None.   DRAINS:  None.   DESCRIPTION OF PROCEDURE:  The patient was taken to the operating room with  the induction of adequate general anesthesia.  Right upper extremity was  prepped and draped in the usual sterile fashion.  An Esmarch was used to  exsanguinate the limb.  The tourniquet was inflated to 250 mmHg.  At this  point in time, an incision was made on the medial aspect of the right elbow  centered between the olecranon process and the medial epicondyle using her  old incision but extended proximally and distally for another 3 cm, totaling  8 cm in length.  The incision was taken down through the skin and  subcutaneous tissues.  Care was undertaken to identify and preserve branches  of the medial antebrachial cutaneous nerve.  Once this was done, the ulnar  nerve was identified in the cubital tunnel.  The cubital tunnel was  released.  The ulnar nerve was traced down and decompressed to the level of  the flexor carpi ulnaris fascia and proximally to the level 6-8 cm above the  medial epicondyle.  The scarred intramuscular septum in the medial aspect  was excised.  At this point in time, the nerve was carefully  transposed with  preservation of large articular branches and the branches of the flexor  ulnaris muscle anteriorly.  A trough was outlined in the flexor pronator  mass.  The nerve was then placed back into the groove and a trough was made  in the flexor pronator mass using a 15 blade.  All fibrous bands were  excised.  The nerve was then transposed into this groove and secured with  the wrist flexed and the forearm pronated using 2-0 Vicryl to close the  fascia, thus creating an intramuscular transposition.  There were no  significant pressure points.  It was stable in the transposed position with  gliding both of flexion and extension.  The wound was then thoroughly  irrigated, was loosely closed in layers of 2-0 Vicryl and a  running 3-0 Prolene subcuticular stitch.  Steri-Strips, 4x4's, fluffs, and a  dressing was applied with the elbow in 9 degrees of flexion, the forearm  pronated, and the wrist flexed.  The patient tolerated the procedure well,  went to recovery in stable fashion.  Matt   MAW/MEDQ  D:  07/23/2004  T:  07/23/2004  Job:  [id number]

## 2011-02-20 ENCOUNTER — Telehealth: Payer: Self-pay | Admitting: *Deleted

## 2011-02-20 NOTE — Telephone Encounter (Signed)
Pt sees Dr Rodena Medin and does not want to transfer to [facility] with him.  She would like to see Dr Fabian Sharp.  Received a letter a mail that she could pick a dr

## 2011-02-23 ENCOUNTER — Telehealth: Payer: Self-pay | Admitting: *Deleted

## 2011-02-23 NOTE — Telephone Encounter (Signed)
Not taking new medicare to me  at this time.

## 2011-02-23 NOTE — Telephone Encounter (Signed)
error 

## 2011-02-24 NOTE — Telephone Encounter (Signed)
OK 

## 2011-02-24 NOTE — Telephone Encounter (Signed)
Pt would like to see Dr Kirtland Bouchard if she can

## 2011-02-25 NOTE — Telephone Encounter (Signed)
Pt.notified

## 2011-03-30 ENCOUNTER — Other Ambulatory Visit: Payer: Self-pay

## 2011-03-30 DIAGNOSIS — F419 Anxiety disorder, unspecified: Secondary | ICD-10-CM

## 2011-03-30 MED ORDER — DIAZEPAM 5 MG PO TABS: 5.0000 mg | ORAL_TABLET | Freq: Three times a day (TID) | ORAL | Status: DC | PRN

## 2011-03-30 NOTE — Telephone Encounter (Signed)
Faxed back to [facility] 

## 2011-05-20 ENCOUNTER — Ambulatory Visit: Payer: Medicare Other | Admitting: Internal Medicine

## 2011-05-21 ENCOUNTER — Ambulatory Visit (INDEPENDENT_AMBULATORY_CARE_PROVIDER_SITE_OTHER): Payer: Medicare Other | Admitting: Internal Medicine

## 2011-05-21 ENCOUNTER — Encounter: Payer: Self-pay | Admitting: Internal Medicine

## 2011-05-21 DIAGNOSIS — N951 Menopausal and female climacteric states: Secondary | ICD-10-CM

## 2011-05-21 DIAGNOSIS — E039 Hypothyroidism, unspecified: Secondary | ICD-10-CM

## 2011-05-21 DIAGNOSIS — R109 Unspecified abdominal pain: Secondary | ICD-10-CM

## 2011-05-21 NOTE — Progress Notes (Signed)
  Subjective:    Patient ID: Christina Peck, female    DOB: 1927/08/08, 77 y.o.   MRN: [id number]  HPI  77 year old patient who presents with a chief complaint of lower mid abdominal pain. She states this has been present for approximately 2 months. There's been no documented weight loss. Denies any nausea vomiting or diarrhea. She does state that approximately one month ago she noted a single episode of bright rectal bleeding. A few days ago she also noticed some blood on her undergarments that she felt was probably vaginal and not rectal. She has had a remote hysterectomy in 1972. She has refused prior colonoscopies. Laboratory studies were done in the spring and were unremarkable. She does have a history of low back pain. She states that her present lower mid abdominal pain radiates to the back. Pain has worsened over the past 2 months she also describes nocturnal pain and often awakens her at 4 to 5 in the morning;  denies any fever or chills    Review of Systems  Constitutional: Negative for fever, appetite change, fatigue and unexpected weight change.  HENT: Negative for hearing loss, ear pain, nosebleeds, congestion, sore throat, mouth sores, trouble swallowing, neck stiffness, dental problem, voice change, sinus pressure and tinnitus.   Eyes: Negative for photophobia, pain, redness and visual disturbance.  Respiratory: Negative for cough, chest tightness and shortness of breath.   Cardiovascular: Negative for chest pain, palpitations and leg swelling.  Gastrointestinal: Positive for abdominal pain and blood in stool. Negative for nausea, vomiting, diarrhea, constipation, abdominal distention and rectal pain.  Genitourinary: Negative for dysuria, urgency, frequency, hematuria, flank pain, vaginal bleeding, vaginal discharge, difficulty urinating, genital sores, vaginal pain, menstrual problem and pelvic pain.  Musculoskeletal: Negative for back pain and arthralgias.  Skin: Negative for rash.    Neurological: Negative for dizziness, syncope, speech difficulty, weakness, light-headedness, numbness and headaches.  Hematological: Negative for adenopathy. Does not bruise/bleed easily.  Psychiatric/Behavioral: Negative for suicidal ideas, behavioral problems, self-injury, dysphoric mood and agitation. The patient is not nervous/anxious.        Objective:   Physical Exam  Constitutional: She appears well-developed and well-nourished. No distress.  Abdominal: Soft. Bowel sounds are normal. She exhibits no distension and no mass. There is no tenderness. There is no rebound and no guarding.       Surgical scar right lower quadrant and transverse suprapubic scar No mass or organomegaly. No guarding tenderness  Genitourinary: Guaiac negative stool.       Status post hysterectomy Atrophic changes only          Assessment & Plan:   Abdominal pain unclear etiology Atrophic vaginitis  We'll check screening labs and stool for occult blood. Will recheck in 2 weeks. We'll consider a colonoscopy and CT abdominal scan if symptoms persist

## 2011-05-21 NOTE — Progress Notes (Signed)
  Subjective:    Patient ID: Christina Peck, female    DOB: July 09, 1927, 77 y.o.   MRN: [id number]  HPI  Wt Readings from Last 3 Encounters:  05/21/11 110 lb (49.896 kg)  12/15/10 110 lb (49.896 kg)  12/04/10 111 lb 6.4 oz (50.531 kg)     Review of Systems     Objective:   Physical Exam        Assessment & Plan:

## 2011-05-21 NOTE — Patient Instructions (Signed)
Return office visit in 2 weeks  Call or return to clinic prn if these symptoms worsen or fail to improve as anticipated.

## 2011-05-22 ENCOUNTER — Other Ambulatory Visit: Payer: Self-pay | Admitting: *Deleted

## 2011-05-22 DIAGNOSIS — F419 Anxiety disorder, unspecified: Secondary | ICD-10-CM

## 2011-05-22 LAB — COMPREHENSIVE METABOLIC PANEL
AST: 22 U/L (ref 0–37)
Albumin: 3.9 g/dL (ref 3.5–5.2)
BUN: 22 mg/dL (ref 6–23)
CO2: 31 mEq/L (ref 19–32)
Calcium: 9 mg/dL (ref 8.4–10.5)
Chloride: 103 mEq/L (ref 96–112)
Potassium: 4.5 mEq/L (ref 3.5–5.1)

## 2011-05-22 LAB — CBC WITH DIFFERENTIAL/PLATELET
Basophils Absolute: 0.1 10*3/uL (ref 0.0–0.1)
Basophils Relative: 2 % (ref 0.0–3.0)
Eosinophils Absolute: 0.3 10*3/uL (ref 0.0–0.7)
Lymphocytes Relative: 26.2 % (ref 12.0–46.0)
MCHC: 33.1 g/dL (ref 30.0–36.0)
Neutrophils Relative %: 57.5 % (ref 43.0–77.0)
RBC: 4.37 Mil/uL (ref 3.87–5.11)
RDW: 13.9 % (ref 11.5–14.6)

## 2011-05-22 MED ORDER — ASPIRIN 81 MG PO TABS: 81.0000 mg | ORAL_TABLET | Freq: Every day | ORAL | Status: AC

## 2011-05-22 MED ORDER — DIAZEPAM 5 MG PO TABS: 2.0000 mg | ORAL_TABLET | Freq: Three times a day (TID) | ORAL | Status: DC | PRN

## 2011-05-22 NOTE — Telephone Encounter (Signed)
Pt called stating her after visit list of meds had error.  Corrected per Pt.

## 2011-05-26 ENCOUNTER — Other Ambulatory Visit (INDEPENDENT_AMBULATORY_CARE_PROVIDER_SITE_OTHER): Payer: Medicare Other

## 2011-05-26 DIAGNOSIS — K921 Melena: Secondary | ICD-10-CM

## 2011-05-26 DIAGNOSIS — IMO0001 Reserved for inherently not codable concepts without codable children: Secondary | ICD-10-CM

## 2011-05-26 LAB — HEMOCCULT GUIAC POC 1CARD (OFFICE): Card #2 Fecal Occult Blod, POC: NEGATIVE

## 2011-06-04 ENCOUNTER — Encounter: Payer: Self-pay | Admitting: Internal Medicine

## 2011-06-04 ENCOUNTER — Ambulatory Visit (INDEPENDENT_AMBULATORY_CARE_PROVIDER_SITE_OTHER): Payer: Medicare Other | Admitting: Internal Medicine

## 2011-06-04 DIAGNOSIS — R05 Cough: Secondary | ICD-10-CM

## 2011-06-04 DIAGNOSIS — R109 Unspecified abdominal pain: Secondary | ICD-10-CM

## 2011-06-04 DIAGNOSIS — G8929 Other chronic pain: Secondary | ICD-10-CM

## 2011-06-04 DIAGNOSIS — R059 Cough, unspecified: Secondary | ICD-10-CM

## 2011-06-04 DIAGNOSIS — N951 Menopausal and female climacteric states: Secondary | ICD-10-CM

## 2011-06-04 NOTE — Progress Notes (Signed)
  Subjective:    Patient ID: Christina Peck, female    DOB: April 01, 1927, 77 y.o.   MRN: [id number]  HPI 77 year old patient who is seen today for followup. She was seen 2 weeks ago complaining of lower abdominal pain. Clinical exam at that time was fairly unremarkable except for atrophic vaginitis. She did return a number of stool specimens all negative for occult blood. She complains of pain mainly in the lower mid abdominal area with some radiation to the back area. She has some chronic constipation issues that are managed fairly well. There has been some concern about weight loss but this has not been documented. Laboratory screening has been unremarkable;  she has declined a colonoscopy.  Weight: 110 lb (49.896 kg)     Review of Systems  Constitutional: Negative.   HENT: Negative for hearing loss, congestion, sore throat, rhinorrhea, dental problem, sinus pressure and tinnitus.   Eyes: Negative for pain, discharge and visual disturbance.  Respiratory: Positive for cough. Negative for shortness of breath.   Cardiovascular: Negative for chest pain, palpitations and leg swelling.  Gastrointestinal: Positive for abdominal pain. Negative for nausea, vomiting, diarrhea, constipation, blood in stool and abdominal distention.  Genitourinary: Negative for dysuria, urgency, frequency, hematuria, flank pain, vaginal bleeding, vaginal discharge, difficulty urinating, vaginal pain and pelvic pain.  Musculoskeletal: Negative for joint swelling, arthralgias and gait problem.  Skin: Negative for rash.  Neurological: Negative for dizziness, syncope, speech difficulty, weakness, numbness and headaches.  Hematological: Negative for adenopathy.  Psychiatric/Behavioral: Negative for behavioral problems, dysphoric mood and agitation. The patient is not nervous/anxious.        Objective:   Physical Exam  Constitutional: She is oriented to person, place, and time. She appears well-developed and well-nourished.  No distress.  HENT:  Head: Normocephalic.  Right Ear: External ear normal.  Left Ear: External ear normal.  Mouth/Throat: Oropharynx is clear and moist.  Eyes: Conjunctivae and EOM are normal. Pupils are equal, round, and reactive to light.  Neck: Normal range of motion. Neck supple. No thyromegaly present.  Cardiovascular: Normal rate, regular rhythm, normal heart sounds and intact distal pulses.   Pulmonary/Chest: Effort normal and breath sounds normal.  Abdominal: Soft. Bowel sounds are normal. She exhibits no distension and no mass. There is no tenderness. There is no rebound and no guarding.       Surgical scar right lower abdomen. Unremarkable exam  Musculoskeletal: Normal range of motion.  Lymphadenopathy:    She has no cervical adenopathy.  Neurological: She is alert and oriented to person, place, and time.  Skin: Skin is warm and dry. No rash noted.  Psychiatric: She has a normal mood and affect. Her behavior is normal.          Assessment & Plan:   Chronic abdominal pain. We'll proceed with abdominal and pelvic CT scan Chronic cough History of dyslipidemia

## 2011-06-04 NOTE — Patient Instructions (Signed)
CT abdomen and pelvis as discussed  Call or return to clinic prn if these symptoms worsen or fail to improve as anticipated.

## 2011-06-09 ENCOUNTER — Ambulatory Visit (INDEPENDENT_AMBULATORY_CARE_PROVIDER_SITE_OTHER)
Admission: RE | Admit: 2011-06-09 | Discharge: 2011-06-09 | Disposition: A | Discharge status: Home / Self Care | Payer: Medicare Other | Source: Ambulatory Visit | Attending: Internal Medicine | Admitting: Internal Medicine

## 2011-06-09 DIAGNOSIS — R109 Unspecified abdominal pain: Secondary | ICD-10-CM

## 2011-06-09 MED ORDER — IOHEXOL 300 MG/ML  SOLN
80.0000 mL | Freq: Once | INTRAMUSCULAR | Status: AC | PRN
  Administered 2011-06-09: 80 mL via INTRAVENOUS

## 2011-06-10 NOTE — Progress Notes (Signed)
Quick Note:  Given to dr. Tawanna Cooler for review ______

## 2011-06-10 NOTE — Progress Notes (Signed)
Quick Note:  Spoke with pt - informed dr. Amador Cunas out of office until next wed. - dr. Tawanna Cooler reviewed and there is no clear expliantion for pain - recommeneded GI referral , pt declined at this time - has been pain free for last several days - will let us know if pain returens and wants GI appt. KIK ______

## 2011-06-16 ENCOUNTER — Other Ambulatory Visit: Payer: Self-pay | Admitting: Internal Medicine

## 2011-07-10 ENCOUNTER — Telehealth: Payer: Self-pay | Admitting: Family Medicine

## 2011-07-16 NOTE — Telephone Encounter (Signed)
Error

## 2011-08-10 ENCOUNTER — Telehealth: Payer: Self-pay | Admitting: Family Medicine

## 2011-08-10 NOTE — Telephone Encounter (Signed)
Refill request for Hydromet syrup, pt last here on 06/04/11.

## 2011-08-12 NOTE — Telephone Encounter (Signed)
This is a Dr. Kwiatkowski patient  

## 2011-08-13 MED ORDER — HYDROCODONE-HOMATROPINE 5-1.5 MG/5ML PO SYRP: 5.0000 mL | ORAL_SOLUTION | Freq: Three times a day (TID) | ORAL | Status: AC | PRN

## 2011-08-13 NOTE — Telephone Encounter (Signed)
Addended by: Duard Brady I on: 08/13/2011 09:11 AM   Modules accepted: Orders

## 2011-08-13 NOTE — Telephone Encounter (Signed)
Last seen 06/04/11  Last written 11/2010  \please advise

## 2011-08-13 NOTE — Telephone Encounter (Signed)
6 oz 

## 2011-10-06 ENCOUNTER — Telehealth: Payer: Self-pay | Admitting: Internal Medicine

## 2011-10-06 NOTE — Telephone Encounter (Signed)
Pt called and said that her insurance is changing on 11/08/11 and pt would like to know if she needs to sch a cpx or not. Pt said that her husband is coming in for cpx on 10/09/11. Pt said that she came in for ov to see Dr Amador Cunas last year and believes that he did lab work then. Would this count toward cpx?

## 2011-10-06 NOTE — Telephone Encounter (Signed)
The last cpx i see in chart was 2011 with dr. Scotty Court -labs were done 11/2010 by dr. Rodena Medin but he saw her for fatigue - saw dr. Amador Cunas later on for abd pain and rov- yes she needs to schedule cpx Please assist in making appt if desired by pt.

## 2011-10-08 NOTE — Telephone Encounter (Signed)
Pt is req to come in for fasting cpx in March 2013 at around 10:30 or 11 am. Is it ok to work in sch some time in month?

## 2011-10-08 NOTE — Telephone Encounter (Signed)
Only in the AM - not a WED or a Friday

## 2011-10-09 ENCOUNTER — Other Ambulatory Visit: Payer: Self-pay

## 2011-10-09 DIAGNOSIS — F419 Anxiety disorder, unspecified: Secondary | ICD-10-CM

## 2011-10-09 MED ORDER — DIAZEPAM 5 MG PO TABS: 2.0000 mg | ORAL_TABLET | Freq: Three times a day (TID) | ORAL | Status: DC | PRN

## 2011-10-09 NOTE — Telephone Encounter (Signed)
Called pt and schd her for fasting cpx for Tuesday 10/13/11 at 10:30am as noted.

## 2011-10-13 ENCOUNTER — Encounter: Payer: Medicare Other | Admitting: Internal Medicine

## 2011-11-13 ENCOUNTER — Ambulatory Visit (INDEPENDENT_AMBULATORY_CARE_PROVIDER_SITE_OTHER): Payer: Medicare Other | Admitting: Internal Medicine

## 2011-11-13 ENCOUNTER — Encounter: Payer: Self-pay | Admitting: Internal Medicine

## 2011-11-13 VITALS — BP 114/70 | HR 71 | Temp 97.6°F | Resp 18 | Ht 60.0 in | Wt 109.0 lb

## 2011-11-13 DIAGNOSIS — E039 Hypothyroidism, unspecified: Secondary | ICD-10-CM

## 2011-11-13 DIAGNOSIS — I679 Cerebrovascular disease, unspecified: Secondary | ICD-10-CM | POA: Diagnosis not present

## 2011-11-13 DIAGNOSIS — E785 Hyperlipidemia, unspecified: Secondary | ICD-10-CM

## 2011-11-13 DIAGNOSIS — M949 Disorder of cartilage, unspecified: Secondary | ICD-10-CM

## 2011-11-13 DIAGNOSIS — M899 Disorder of bone, unspecified: Secondary | ICD-10-CM | POA: Diagnosis not present

## 2011-11-13 DIAGNOSIS — Z0000 Encounter for general adult medical examination without abnormal findings: Secondary | ICD-10-CM | POA: Diagnosis not present

## 2011-11-13 LAB — LIPID PANEL
HDL: 68.2 mg/dL (ref >39.00)
LDL Cholesterol: 101 mg/dL — ABNORMAL HIGH (ref 0–99)
Total CHOL/HDL Ratio: 3
VLDL: 18 mg/dL (ref 0.0–40.0)

## 2011-11-13 MED ORDER — ESTROGENS CONJUGATED 0.625 MG PO TABS: 0.6250 mg | ORAL_TABLET | Freq: Every day | ORAL | Status: DC

## 2011-11-13 MED ORDER — DIAZEPAM 2 MG PO TABS: 2.0000 mg | ORAL_TABLET | Freq: Three times a day (TID) | ORAL | Status: DC
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
## 2011-11-13 NOTE — Patient Instructions (Signed)
It is important that you exercise regularly, at least 20 minutes 3 to 4 times per week.  If you develop chest pain or shortness of breath seek  medical attention.  Take a calcium supplement, plus [phone] units of vitamin D  Take 81 mg of aspirin daily  Please see your eye doctor yearly to check for  eye damage  Return in one year for follow-up

## 2011-11-27 ENCOUNTER — Telehealth: Payer: Self-pay | Admitting: Internal Medicine

## 2011-11-27 NOTE — Telephone Encounter (Signed)
Pt requesting results of lab work. Please contact. °

## 2011-11-27 NOTE — Telephone Encounter (Signed)
Spoke with pt - informed labs normal - mailed copy per request

## 2011-12-21 ENCOUNTER — Telehealth: Payer: Self-pay | Admitting: Family Medicine

## 2011-12-21 MED ORDER — DIAZEPAM 2 MG PO TABS: 2.0000 mg | ORAL_TABLET | Freq: Three times a day (TID) | ORAL | Status: DC

## 2011-12-21 NOTE — Telephone Encounter (Signed)
Called in.

## 2011-12-21 NOTE — Telephone Encounter (Signed)
Pt called 8:59a.m. Pulled from Triage vmail. She needs refill on her Diazepam, however, due to insurance has to switch pharmacies. Please call in refill to [facility] on [address]. Thanks!

## 2011-12-23 ENCOUNTER — Telehealth: Payer: Self-pay | Admitting: *Deleted

## 2011-12-23 NOTE — Telephone Encounter (Signed)
Please call pt about her refill about her refills of Valium.  She is almost out and needs to get it ASAP at another pharmacy due to her insurance.

## 2011-12-23 NOTE — Telephone Encounter (Signed)
Spoke with [facility]- they have rx we called in on Monday ready for pick up  I called pt and made her aware

## 2012-01-27 ENCOUNTER — Telehealth: Payer: Self-pay | Admitting: Internal Medicine

## 2012-01-27 NOTE — Telephone Encounter (Signed)
Patient called stating that she need a refill of her premarin 0.625 90 day supply sent to [facility] on [address] [phone] and it requires prior authorization. Please assist.

## 2012-01-28 ENCOUNTER — Ambulatory Visit: Payer: Medicare Other | Admitting: Internal Medicine

## 2012-02-04 ENCOUNTER — Encounter: Payer: Self-pay | Admitting: Internal Medicine

## 2012-02-04 ENCOUNTER — Ambulatory Visit (INDEPENDENT_AMBULATORY_CARE_PROVIDER_SITE_OTHER): Payer: Medicare Other | Admitting: Internal Medicine

## 2012-02-04 VITALS — BP 114/70 | HR 64 | Temp 97.7°F | Wt 110.0 lb

## 2012-02-04 DIAGNOSIS — K089 Disorder of teeth and supporting structures, unspecified: Secondary | ICD-10-CM | POA: Diagnosis not present

## 2012-02-04 DIAGNOSIS — K0889 Other specified disorders of teeth and supporting structures: Secondary | ICD-10-CM

## 2012-02-04 NOTE — Patient Instructions (Signed)
Followup with general dentistry if unimproved next week Call if you develop a fever or any worsening pain

## 2012-02-04 NOTE — Progress Notes (Signed)
  Subjective:    Patient ID: Hall Busing, female    DOB: [birth year], 78 y.o.   MRN: [id number]  HPI  78 year old patient who underwent procedures at her general dentist 9 days ago. She received bilateral anesthetics apparently with epinephrine through place to old fillings. She had a difficult time during the procedure with considerable discomfort. She also began shaking vigorously probably from the effects of the epinephrine. The procedure was completed. She has had persistent pain involving both mandible areas the left greater than the right. Does not seem to be TMJ pain. She did contact her dentist who suggested ibuprofen. There's been no fever or other complaints include some mild pain involving the left facial area. Symptoms seem to be improving modestly    Review of Systems  Constitutional: Negative.   HENT: Negative.        Objective:   Physical Exam  Constitutional: She appears well-developed and well-nourished. No distress.  HENT:       Oropharynx was unremarkable The right lower molar had a silver filling which was quite large. Teeth and gums appear to be unremarkable There was some slight tenderness involving the left mid mandibular area to external palpation No TMJ tenderness          Assessment & Plan:   Postprocedural left mandibular pain. The patient seems to be slowly improving. Options were discussed. She does not wish to return to her prior dentist but will see a new dentist next week if unimproved.

## 2012-02-10 DIAGNOSIS — H353190 Nonexudative age-related macular degeneration, unspecified eye, stage unspecified: Secondary | ICD-10-CM | POA: Diagnosis not present

## 2012-03-17 ENCOUNTER — Other Ambulatory Visit: Payer: Self-pay | Admitting: Internal Medicine

## 2012-05-03 ENCOUNTER — Telehealth: Payer: Self-pay | Admitting: Internal Medicine

## 2012-05-03 ENCOUNTER — Other Ambulatory Visit: Payer: Self-pay | Admitting: Internal Medicine

## 2012-05-03 NOTE — Telephone Encounter (Signed)
Patient spouse called stating that they are going out of the country and his wife needs a refill of her diazepam called into [facility] for a two month supply. Please assist.

## 2012-05-03 NOTE — Telephone Encounter (Signed)
Last written 03/17/12 # 90 1RF , taking tid Need ok to RF early - not due until 05/17/12 , and ok to give #180 since they are travelling out of country for 2 mos

## 2012-05-04 NOTE — Telephone Encounter (Signed)
ok 

## 2012-05-04 NOTE — Telephone Encounter (Signed)
Med called in to [facility] - per pharmacy - will need to know where and the dates of travel

## 2012-05-04 NOTE — Telephone Encounter (Signed)
[place]. 9/30 - 11/17

## 2012-05-04 NOTE — Telephone Encounter (Signed)
[facility] called - spoke with gwen - , will hold rx  until we get approval

## 2012-05-04 NOTE — Telephone Encounter (Signed)
Called in - pharmacy will need to know dates and where

## 2012-05-04 NOTE — Telephone Encounter (Signed)
Called in - pt may have to pay out of pocket for early RF

## 2012-07-28 ENCOUNTER — Telehealth: Payer: Self-pay | Admitting: Internal Medicine
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
## 2012-07-28 NOTE — Telephone Encounter (Signed)
Appt for tomorrow sufficient per Dr. Kirtland Bouchard. Encounter closed.

## 2012-07-29 ENCOUNTER — Encounter: Payer: Self-pay | Admitting: Internal Medicine

## 2012-07-29 ENCOUNTER — Ambulatory Visit (INDEPENDENT_AMBULATORY_CARE_PROVIDER_SITE_OTHER): Payer: Medicare Other | Admitting: Internal Medicine

## 2012-07-29 VITALS — BP 110/60 | HR 60 | Temp 97.5°F | Resp 16 | Wt 110.0 lb

## 2012-07-29 DIAGNOSIS — E039 Hypothyroidism, unspecified: Secondary | ICD-10-CM | POA: Diagnosis not present

## 2012-07-29 DIAGNOSIS — N951 Menopausal and female climacteric states: Secondary | ICD-10-CM | POA: Diagnosis not present

## 2012-07-29 DIAGNOSIS — R5383 Other fatigue: Secondary | ICD-10-CM

## 2012-07-29 DIAGNOSIS — R209 Unspecified disturbances of skin sensation: Secondary | ICD-10-CM

## 2012-07-29 DIAGNOSIS — R5381 Other malaise: Secondary | ICD-10-CM

## 2012-07-29 DIAGNOSIS — R202 Paresthesia of skin: Secondary | ICD-10-CM

## 2012-07-29 MED ORDER — SERTRALINE HCL 25 MG PO TABS: 25.0000 mg | ORAL_TABLET | Freq: Every day | ORAL | Status: DC

## 2012-07-29 NOTE — Patient Instructions (Signed)
It is important that you exercise regularly, at least 20 minutes 3 to 4 times per week.  If you develop chest pain or shortness of breath seek  medical attention.  Return in 6 weeks for follow-up 

## 2012-07-29 NOTE — Progress Notes (Signed)
Subjective:    Patient ID: Christina Peck, female    DOB: 01-04-1927, 78 y.o.   MRN: [id number]  HPI  78 year old patient who is seen today with a chief complaint of tremors she states this has been a problem since October. Tremors aggravated by stress. She also complains of being weak and feels that she is deteriorating. It seems she is under much stress at home. She states she sleeps 12 hours daily. She also describes some numbness involving the hands and feet.  Wt Readings from Last 3 Encounters:  07/29/12 110 lb (49.896 kg)  02/04/12 110 lb (49.896 kg)  11/13/11 109 lb (49.442 kg)    Past Medical History  Diagnosis Date  . TIA (transient ischemic attack) 1995  . Tremors of nervous system     History   Social History  . Marital Status: Married    Spouse Name: N/A    Number of Children: N/A  . Years of Education: N/A   Occupational History  . Not on file.   Social History Main Topics  . Smoking status: Former Smoker -- 0.5 packs/day for 10 years    Types: Cigarettes    Quit date: 11/11/1972  . Smokeless tobacco: Never Used  . Alcohol Use: Yes     Comment: 1 glass wine daily  . Drug Use: No  . Sexually Active: Not on file   Other Topics Concern  . Not on file   Social History Narrative  . No narrative on file    Past Surgical History  Procedure Date  . Back surgery 1977 and 1978  . Vesicovaginal fistula closure w/ tah 1971  . Appendectomy 1967    No family history on file.  Allergies  Allergen Reactions  . Epinephrine   . Other     Septocaine per dentist    Current Outpatient Prescriptions on File Prior to Visit  Medication Sig Dispense Refill  . aspirin 81 MG tablet Take 1 tablet (81 mg total) by mouth daily. 2 daily.  30 tablet  11  . calcium-vitamin D (OSCAL WITH D 250-125) 250-125 MG-UNIT per tablet Take 1 tablet by mouth daily.        . diazepam (VALIUM) 2 MG tablet TAKE 1 TABLET BY MOUTH THREE TIMES DAILY  180 tablet  0  . estrogens,  conjugated, (PREMARIN) 0.625 MG tablet Take 1 tablet (0.625 mg total) by mouth daily. Take daily for 21 days then do not take for 7 days.  90 tablet  3  . Multiple Vitamins-Minerals (ICAPS MV PO) Take by mouth.          BP 110/60  Pulse 60  Temp 97.5 F (36.4 C) (Oral)  Resp 16  Wt 110 lb (49.896 kg)  SpO2 96%      Review of Systems  HENT: Negative for hearing loss, congestion, sore throat, rhinorrhea, dental problem, sinus pressure and tinnitus.   Eyes: Negative for pain, discharge and visual disturbance.  Respiratory: Negative for cough and shortness of breath.   Cardiovascular: Negative for chest pain, palpitations and leg swelling.  Gastrointestinal: Negative for nausea, vomiting, abdominal pain, diarrhea, constipation, blood in stool and abdominal distention.  Genitourinary: Negative for dysuria, urgency, frequency, hematuria, flank pain, vaginal bleeding, vaginal discharge, difficulty urinating, vaginal pain and pelvic pain.  Musculoskeletal: Negative for joint swelling, arthralgias and gait problem.  Skin: Negative for rash.  Neurological: Positive for tremors and weakness. Negative for dizziness, syncope, speech difficulty, numbness and headaches.  Hematological: Negative for adenopathy.  Psychiatric/Behavioral: Negative for behavioral problems, dysphoric mood and agitation. The patient is not nervous/anxious.        Objective:   Physical Exam  Constitutional: She is oriented to person, place, and time. She appears well-developed and well-nourished. No distress.       Anxious but in no acute distress. Blood pressure normal  HENT:  Head: Normocephalic.  Right Ear: External ear normal.  Left Ear: External ear normal.  Mouth/Throat: Oropharynx is clear and moist.  Eyes: Conjunctivae normal and EOM are normal. Pupils are equal, round, and reactive to light.  Neck: Normal range of motion. Neck supple. No thyromegaly present.  Cardiovascular: Normal rate, regular rhythm,  normal heart sounds and intact distal pulses.   Pulmonary/Chest: Effort normal and breath sounds normal.  Abdominal: Soft. Bowel sounds are normal. She exhibits no mass. There is no tenderness.  Musculoskeletal: Normal range of motion.  Lymphadenopathy:    She has no cervical adenopathy.  Neurological: She is alert and oriented to person, place, and time. She has normal reflexes. No cranial nerve deficit. Coordination normal.       Significant intention tremor with finger to nose testing area and No resting tremor Heel-to-shin testing normal Gait normal  Skin: Skin is warm and dry. No rash noted.  Psychiatric: She has a normal mood and affect. Her behavior is normal.          Assessment & Plan:   Intention tremor aggravated by stress History of hypothyroidism. We'll check a TSH Weakness. We'll check some updated lab  The patient is using diazepam 2 mg twice daily and 1 mg third daily dose ( total 5 mg daily) Will add Zoloft 25 mg daily. Recheck 6 weeks We'll consider behavioral health referral

## 2012-08-18 ENCOUNTER — Other Ambulatory Visit: Payer: Self-pay | Admitting: Internal Medicine

## 2012-09-20 ENCOUNTER — Encounter: Payer: Self-pay | Admitting: Internal Medicine

## 2012-09-20 ENCOUNTER — Ambulatory Visit (INDEPENDENT_AMBULATORY_CARE_PROVIDER_SITE_OTHER): Payer: Medicare Other | Admitting: Internal Medicine

## 2012-09-20 VITALS — BP 140/70 | HR 56 | Temp 97.5°F | Resp 16 | Wt 109.0 lb

## 2012-09-20 DIAGNOSIS — M949 Disorder of cartilage, unspecified: Secondary | ICD-10-CM

## 2012-09-20 DIAGNOSIS — M899 Disorder of bone, unspecified: Secondary | ICD-10-CM

## 2012-09-20 DIAGNOSIS — I679 Cerebrovascular disease, unspecified: Secondary | ICD-10-CM

## 2012-09-20 DIAGNOSIS — E785 Hyperlipidemia, unspecified: Secondary | ICD-10-CM

## 2012-09-20 DIAGNOSIS — E039 Hypothyroidism, unspecified: Secondary | ICD-10-CM | POA: Diagnosis not present

## 2012-09-20 DIAGNOSIS — R413 Other amnesia: Secondary | ICD-10-CM | POA: Diagnosis not present

## 2012-09-20 DIAGNOSIS — R5383 Other fatigue: Secondary | ICD-10-CM

## 2012-09-20 DIAGNOSIS — R5381 Other malaise: Secondary | ICD-10-CM | POA: Diagnosis not present

## 2012-09-20 LAB — COMPREHENSIVE METABOLIC PANEL
AST: 24 U/L (ref 0–37)
Albumin: 3.9 g/dL (ref 3.5–5.2)
BUN: 20 mg/dL (ref 6–23)
CO2: 31 mEq/L (ref 19–32)
Calcium: 9.3 mg/dL (ref 8.4–10.5)
Chloride: 103 mEq/L (ref 96–112)
Creatinine, Ser: 0.8 mg/dL (ref 0.4–1.2)
GFR: 78.39 mL/min (ref >60.00)
Glucose, Bld: 87 mg/dL (ref 70–99)
Potassium: 5 mEq/L (ref 3.5–5.1)

## 2012-09-20 LAB — CBC WITH DIFFERENTIAL/PLATELET
Basophils Relative: 0.4 % (ref 0.0–3.0)
Eosinophils Absolute: 0.1 10*3/uL (ref 0.0–0.7)
Lymphocytes Relative: 27.7 % (ref 12.0–46.0)
MCHC: 33.1 g/dL (ref 30.0–36.0)
Monocytes Relative: 9.2 % (ref 3.0–12.0)
Neutrophils Relative %: 60.8 % (ref 43.0–77.0)
RBC: 4.77 Mil/uL (ref 3.87–5.11)
WBC: 7.2 10*3/uL (ref 4.5–10.5)

## 2012-09-20 NOTE — Patient Instructions (Addendum)
Return in 3 months for follow-up  

## 2012-09-20 NOTE — Progress Notes (Signed)
Subjective:    Patient ID: Christina Peck, female    DOB: 11/29/1926, 79 y.o.   MRN: [id number]  HPI   79 year old patient who is seen today for general followup. She has a history of dyslipidemia cerebrovascular disease and osteoarthritis. She was seen here one month ago but failed to have the laboratory studies done at that time no concerns or complaints except for a intention tremor that interferes with handwriting. She has been using diazepam with the benefit.  Past Medical History  Diagnosis Date  . TIA (transient ischemic attack) 1995  . Tremors of nervous system     History   Social History  . Marital Status: Married    Spouse Name: N/A    Number of Children: N/A  . Years of Education: N/A   Occupational History  . Not on file.   Social History Main Topics  . Smoking status: Former Smoker -- 0.50 packs/day for 10 years    Types: Cigarettes    Quit date: 11/11/1972  . Smokeless tobacco: Never Used  . Alcohol Use: Yes     Comment: 1 glass wine daily  . Drug Use: No  . Sexually Active: Not on file   Other Topics Concern  . Not on file   Social History Narrative  . No narrative on file    Past Surgical History  Procedure Laterality Date  . Back surgery  1977 and 1978  . Vesicovaginal fistula closure w/ tah  1971  . Appendectomy  1967    No family history on file.  Allergies  Allergen Reactions  . Epinephrine   . Other     Septocaine per dentist    Current Outpatient Prescriptions on File Prior to Visit  Medication Sig Dispense Refill  . aspirin 81 MG tablet Take 1 tablet (81 mg total) by mouth daily. 2 daily.  30 tablet  11  . calcium-vitamin D (OSCAL WITH D 250-125) 250-125 MG-UNIT per tablet Take 1 tablet by mouth daily.        . diazepam (VALIUM) 2 MG tablet TAKE 1 TABLET BY MOUTH THREE TIMES DAILY  180 tablet  0  . estrogens, conjugated, (PREMARIN) 0.625 MG tablet Take 1 tablet (0.625 mg total) by mouth daily. Take daily for 21 days then do not take  for 7 days.  90 tablet  3  . Multiple Vitamins-Minerals (ICAPS MV PO) Take by mouth.        . sertraline (ZOLOFT) 25 MG tablet Take 1 tablet (25 mg total) by mouth daily.  90 tablet  3   No current facility-administered medications on file prior to visit.    BP 140/70  Pulse 56  Temp(Src) 97.5 F (36.4 C) (Oral)  Resp 16  Wt 109 lb (49.442 kg)  BMI 21.29 kg/m2  SpO2 98%       Review of Systems  Constitutional: Negative.   HENT: Negative for hearing loss, congestion, sore throat, rhinorrhea, dental problem, sinus pressure and tinnitus.   Eyes: Negative for pain, discharge and visual disturbance.  Respiratory: Negative for cough and shortness of breath.   Cardiovascular: Negative for chest pain, palpitations and leg swelling.  Gastrointestinal: Negative for nausea, vomiting, abdominal pain, diarrhea, constipation, blood in stool and abdominal distention.  Genitourinary: Negative for dysuria, urgency, frequency, hematuria, flank pain, vaginal bleeding, vaginal discharge, difficulty urinating, vaginal pain and pelvic pain.  Musculoskeletal: Negative for joint swelling, arthralgias and gait problem.  Skin: Negative for rash.  Neurological: Positive for tremors. Negative for dizziness,  syncope, speech difficulty, weakness, numbness and headaches.  Hematological: Negative for adenopathy.  Psychiatric/Behavioral: Negative for behavioral problems, dysphoric mood and agitation. The patient is not nervous/anxious.        Objective:   Physical Exam  Constitutional: She appears well-developed and well-nourished. No distress.  Neurological:  Intention tremor noted with finger to nose testing No resting tremor          Assessment & Plan:   Intention tremor. We'll continue the diazepam which has been quite helpful We'll check some updated lab including the thyroid function studies Dyslipidemia Cerebrovascular disease we'll continue daily aspirin therapy

## 2012-10-06 ENCOUNTER — Telehealth: Payer: Self-pay | Admitting: *Deleted

## 2012-10-06 NOTE — Telephone Encounter (Signed)
Pt would like lab results from 2/11 visit.

## 2012-10-06 NOTE — Telephone Encounter (Signed)
Please call/notify patient that lab/test/procedure is normal 

## 2012-10-11 NOTE — Telephone Encounter (Signed)
Spoke to pt told her lab results were normal. Pt verbalized understanding. 

## 2012-11-04 ENCOUNTER — Other Ambulatory Visit: Payer: Self-pay | Admitting: Internal Medicine

## 2012-11-15 ENCOUNTER — Encounter: Payer: Self-pay | Admitting: Internal Medicine

## 2012-11-15 ENCOUNTER — Ambulatory Visit (INDEPENDENT_AMBULATORY_CARE_PROVIDER_SITE_OTHER): Payer: Medicare Other | Admitting: Internal Medicine

## 2012-11-15 VITALS — BP 110/60 | HR 55 | Resp 18 | Ht 60.25 in | Wt 112.0 lb

## 2012-11-15 DIAGNOSIS — R300 Dysuria: Secondary | ICD-10-CM

## 2012-11-15 DIAGNOSIS — E785 Hyperlipidemia, unspecified: Secondary | ICD-10-CM

## 2012-11-15 DIAGNOSIS — Z0000 Encounter for general adult medical examination without abnormal findings: Secondary | ICD-10-CM

## 2012-11-15 DIAGNOSIS — I679 Cerebrovascular disease, unspecified: Secondary | ICD-10-CM | POA: Diagnosis not present

## 2012-11-15 DIAGNOSIS — E039 Hypothyroidism, unspecified: Secondary | ICD-10-CM

## 2012-11-15 LAB — POCT URINALYSIS DIPSTICK
Bilirubin, UA: NEGATIVE
Glucose, UA: NEGATIVE
Nitrite, UA: NEGATIVE
Spec Grav, UA: 1.025
Urobilinogen, UA: 0.2

## 2012-11-15 MED ORDER — ESTROGENS CONJUGATED 0.625 MG PO TABS: 0.6250 mg | ORAL_TABLET | Freq: Every day | ORAL | Status: DC

## 2012-11-15 MED ORDER — DIAZEPAM 2 MG PO TABS: ORAL_TABLET | ORAL | Status: DC
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
## 2012-11-15 NOTE — Patient Instructions (Signed)
It is important that you exercise regularly, at least 20 minutes 3 to 4 times per week.  If you develop chest pain or shortness of breath seek  medical attention.  Take a calcium supplement, plus 800-1200 units of vitamin D  Return in one year for follow-up   

## 2012-11-15 NOTE — Progress Notes (Signed)
  Subjective:    Patient ID: Christina Peck, female    DOB: 1926/08/24, 79 y.o.   MRN: [id number]  HPI  Wt Readings from Last 3 Encounters:  11/15/12 112 lb (50.803 kg)  09/20/12 109 lb (49.442 kg)  07/29/12 110 lb (49.896 kg)    Review of Systems     Objective:   Physical Exam        Assessment & Plan:

## 2012-11-21 ENCOUNTER — Encounter: Payer: Self-pay | Admitting: Internal Medicine

## 2012-11-21 ENCOUNTER — Telehealth: Payer: Self-pay | Admitting: Internal Medicine

## 2012-11-21 ENCOUNTER — Ambulatory Visit (INDEPENDENT_AMBULATORY_CARE_PROVIDER_SITE_OTHER): Payer: Medicare Other | Admitting: Internal Medicine

## 2012-11-21 VITALS — BP 120/72 | HR 63 | Temp 97.2°F | Wt 112.0 lb

## 2012-11-21 DIAGNOSIS — S0990XA Unspecified injury of head, initial encounter: Secondary | ICD-10-CM

## 2012-11-21 DIAGNOSIS — R259 Unspecified abnormal involuntary movements: Secondary | ICD-10-CM

## 2012-11-21 DIAGNOSIS — S069X0A Unspecified intracranial injury without loss of consciousness, initial encounter: Secondary | ICD-10-CM | POA: Diagnosis not present

## 2012-11-21 DIAGNOSIS — R251 Tremor, unspecified: Secondary | ICD-10-CM

## 2012-11-21 DIAGNOSIS — W19XXXA Unspecified fall, initial encounter: Secondary | ICD-10-CM

## 2012-11-21 DIAGNOSIS — M542 Cervicalgia: Secondary | ICD-10-CM

## 2012-11-21 NOTE — Telephone Encounter (Signed)
Patient Information:  Caller Name: Johnny Bridge  Phone: [phone]  Patient: Christina Peck  Gender: Female  DOB: 1926/11/03  Age: 79 Years  PCP: Eleonore Chiquito [facility])  Office Follow Up:  Does the office need to follow up with this patient?: No  Instructions For The Office: N/A   Symptoms  Reason For Call & Symptoms: Pt fell 2 nights ago/Sat 11/19/12. The rug slipped out from under her feet. She hit her head on the right hand side. She did not get seen by anyone. This am the pt has noticed her neck is sore/she isn't sure if its a muscle  Reviewed Health History In EMR: Yes  Reviewed Medications In EMR: Yes  Reviewed Allergies In EMR: Yes  Reviewed Surgeries / Procedures: Yes  Date of Onset of Symptoms: 11/21/2012  Guideline(s) Used:  Neck Pain or Stiffness  Disposition Per Guideline:   See Today in Office  Reason For Disposition Reached:   Patient wants to be seen  Advice Given:  N/A  Patient Will Follow Care Advice:  YES  Appointment Scheduled:  11/21/2012 15:45:00 Appointment Scheduled Provider:  Berniece Andreas (Family Practice)

## 2012-11-21 NOTE — Patient Instructions (Addendum)
This acts like a whiplash injury and the  Neck muscle has some spasm.   Can get neck  X ray  To see alignment.  Should get  better in the next week or so contact us if not getting better.  Would have you se Dr Kirtland Bouchard about the shaking.  Consider seeing  Neurologist  About this.  Will  Disc with Dr.  Kirtland Bouchard.  \
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
## 2012-11-22 DIAGNOSIS — S0990XA Unspecified injury of head, initial encounter: Secondary | ICD-10-CM | POA: Insufficient documentation

## 2012-11-22 DIAGNOSIS — W19XXXA Unspecified fall, initial encounter: Secondary | ICD-10-CM | POA: Insufficient documentation

## 2012-11-22 DIAGNOSIS — R251 Tremor, unspecified: Secondary | ICD-10-CM | POA: Insufficient documentation

## 2012-11-22 DIAGNOSIS — M542 Cervicalgia: Secondary | ICD-10-CM | POA: Insufficient documentation

## 2012-12-16 ENCOUNTER — Telehealth: Payer: Self-pay | Admitting: *Deleted

## 2012-12-27 ENCOUNTER — Telehealth: Payer: Self-pay | Admitting: Internal Medicine

## 2012-12-27 NOTE — Telephone Encounter (Signed)
Pt did not get the C-spine films ordered by you 11/21/12. Please advise - contact pt or cancel order as appropriate. Pt does have a neuro appt set up.

## 2012-12-28 NOTE — Telephone Encounter (Signed)
If patient follwos through appt with neuro then can cancel appt.

## 2012-12-29 NOTE — Telephone Encounter (Signed)
I cannot cancel the order. Only a clinical person can. Please check w/ pt to see if she's done what Dr Demetrius Charity instructed adn cancel order if appropriate.

## 2012-12-30 NOTE — Telephone Encounter (Signed)
Pt has appt with Neuro, order cancelled

## 2013-01-03 NOTE — Telephone Encounter (Signed)
Giving request to Alverda Skeans or Mickey Farber to designate which physician is to see this patient

## 2013-01-10 ENCOUNTER — Institutional Professional Consult (permissible substitution): Payer: Medicare Other | Admitting: Neurology

## 2013-01-11 ENCOUNTER — Ambulatory Visit (INDEPENDENT_AMBULATORY_CARE_PROVIDER_SITE_OTHER): Payer: Medicare Other | Admitting: Neurology

## 2013-01-11 ENCOUNTER — Encounter: Payer: Self-pay | Admitting: Neurology

## 2013-01-11 VITALS — BP 138/62 | HR 61 | Temp 97.0°F | Ht 60.0 in | Wt 113.0 lb

## 2013-01-11 DIAGNOSIS — S0990XA Unspecified injury of head, initial encounter: Secondary | ICD-10-CM | POA: Diagnosis not present

## 2013-01-11 DIAGNOSIS — G250 Essential tremor: Secondary | ICD-10-CM | POA: Diagnosis not present

## 2013-01-11 DIAGNOSIS — W19XXXS Unspecified fall, sequela: Secondary | ICD-10-CM

## 2013-01-11 NOTE — Patient Instructions (Addendum)
I think overall you are doing fairly well but I do want to suggest a few things today:   You have essential tremor affecting your hands and to a lesser degree in your head. You have no signs of parkinson's disease.   Remember to drink plenty of fluid, eat healthy meals and do not skip any meals. Try to eat protein with a every meal and eat a healthy snack such as fruit or nuts in between meals. Try to keep a regular sleep-wake schedule and try to exercise daily, particularly in the form of walking, 20-30 minutes a day, if you can.   As far as your medications are concerned, I would like to suggest no new medications.   As far as diagnostic testing: I will do a head CT, because you had a recent fall hitting your head.   I would like to see you back on an as needed basis.   Please also call us for any test results so we can go over those with you on the phone. Brett Canales is my clinical assistant and will answer any of your questions and relay your messages to me and also relay most of my messages to you.  Our phone number is [phone]. We also have an after hours call service for urgent matters and there is a physician on-call for urgent questions. For any emergencies you know to call 911 or go to the nearest emergency room.
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
## 2013-01-19 ENCOUNTER — Other Ambulatory Visit: Payer: Self-pay | Admitting: Internal Medicine

## 2013-01-23 ENCOUNTER — Other Ambulatory Visit: Payer: Self-pay | Admitting: Internal Medicine

## 2013-01-23 NOTE — Telephone Encounter (Signed)
Pt misplaced the printed RX for diazepam (VALIUM) 2 MG tablet from her 11/15/12 and would like a refill called in to [facility] [address] [facility] [address]. Pt had no idea she had a script for this med.

## 2013-01-25 ENCOUNTER — Ambulatory Visit
Admission: RE | Admit: 2013-01-25 | Discharge: 2013-01-25 | Disposition: A | Discharge status: Home / Self Care | Payer: Medicare Other | Source: Ambulatory Visit | Attending: Neurology | Admitting: Neurology

## 2013-01-25 DIAGNOSIS — W19XXXS Unspecified fall, sequela: Secondary | ICD-10-CM

## 2013-01-25 DIAGNOSIS — G250 Essential tremor: Secondary | ICD-10-CM

## 2013-01-25 DIAGNOSIS — S0990XA Unspecified injury of head, initial encounter: Secondary | ICD-10-CM

## 2013-01-25 DIAGNOSIS — G252 Other specified forms of tremor: Secondary | ICD-10-CM | POA: Diagnosis not present

## 2013-01-30 NOTE — Progress Notes (Signed)
Quick Note:  The brain scan showed a normal structure of the brain and no significant volume loss which we call atrophy. There were changes in the deeper structures of the brain, which we call white matter changes or microvascular changes. These were reported as mild. These are tiny white spots, that occur with time and are seen in a variety of conditions, including with normal aging, chronic hypertension, chronic headaches, chronic diabetes, chronic hyperlipidemia. These are not strokes and no mass or lesion or contrast enhancement was seen which is reassuring. Again, there were no acute findings, such as a stroke, or mass or blood products. No further action is required on this test at this time, other than re-enforcing the importance of good blood pressure control, good cholesterol control, good blood sugar control, and weight management. Please remind patient to keep any upcoming appointments or tests and to call us with any interim questions, concerns, problems or updates. Thanks,  Harold Barban and I is a and I thank you for asking r, MD, PhD    ______

## 2013-02-03 NOTE — Progress Notes (Signed)
Quick Note:  Spoke with patient and relayed results of CT. Patient understood and had no questions. ______ 

## 2013-03-16 ENCOUNTER — Other Ambulatory Visit: Payer: Self-pay | Admitting: Internal Medicine

## 2013-04-18 ENCOUNTER — Telehealth: Payer: Self-pay | Admitting: Internal Medicine

## 2013-04-18 NOTE — Telephone Encounter (Signed)
PT states that she would like to speak with you while her husband is gone. She stated that her husband will be back home within an hour. Please assist.

## 2013-04-18 NOTE — Telephone Encounter (Signed)
Spoke to pt said she and her husband need to see psychologist having issues. Recommended Judithe Modest in our office but will need to check with insurance to see who they say and what will be covered. Pt verbalized understanding. Told pt to call back if she needs anything. Pt verbalized understanding.

## 2013-04-26 ENCOUNTER — Ambulatory Visit (INDEPENDENT_AMBULATORY_CARE_PROVIDER_SITE_OTHER): Payer: Medicare Other | Admitting: Licensed Clinical Social Worker

## 2013-04-26 DIAGNOSIS — F4323 Adjustment disorder with mixed anxiety and depressed mood: Secondary | ICD-10-CM

## 2013-05-10 DIAGNOSIS — H353190 Nonexudative age-related macular degeneration, unspecified eye, stage unspecified: Secondary | ICD-10-CM | POA: Diagnosis not present

## 2013-06-21 ENCOUNTER — Other Ambulatory Visit: Payer: Self-pay | Admitting: Internal Medicine

## 2013-07-12 DIAGNOSIS — H35349 Macular cyst, hole, or pseudohole, unspecified eye: Secondary | ICD-10-CM | POA: Diagnosis not present

## 2013-07-12 DIAGNOSIS — H353190 Nonexudative age-related macular degeneration, unspecified eye, stage unspecified: Secondary | ICD-10-CM | POA: Diagnosis not present

## 2013-07-25 DIAGNOSIS — H35369 Drusen (degenerative) of macula, unspecified eye: Secondary | ICD-10-CM | POA: Diagnosis not present

## 2013-07-25 DIAGNOSIS — H353190 Nonexudative age-related macular degeneration, unspecified eye, stage unspecified: Secondary | ICD-10-CM | POA: Diagnosis not present

## 2013-07-26 IMAGING — CT CT ABD-PELV W/ CM
2 of 5 series · 16 of 46 positions shown, 18 images · IV contrast (Omnipaque 300)
Comparison: No similar prior study is available for comparison.

CLINICAL DATA: Chronic low abdominal pain and bilateral flank pain.
Prior appendectomy and hysterectomy

CT ABDOMEN AND PELVIS WITH CONTRAST
TECHNIQUE: Multidetector CT imaging of the abdomen and pelvis was
performed following the standard protocol during bolus
administration of intravenous contrast.
Contrast: 80mL OMNIPAQUE IOHEXOL 300 MG/ML IV SOLN

[Series 2: abd/ pel 5mm · axial · 0.62mm/px · z∈[-433,-78]mm · 13 of 79 slices shown, 15 images]
[im 4/79  soft-tissue]
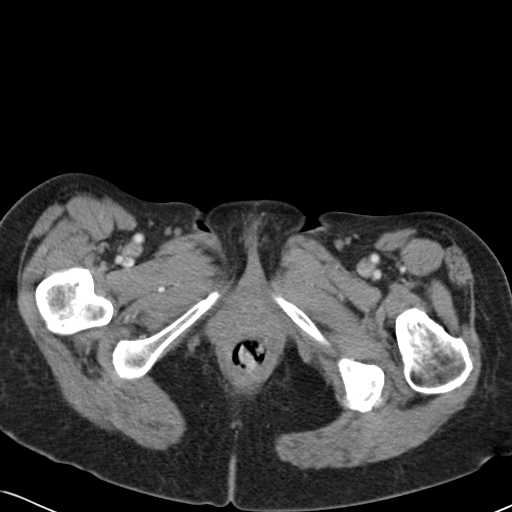
[im 4/79  bone]
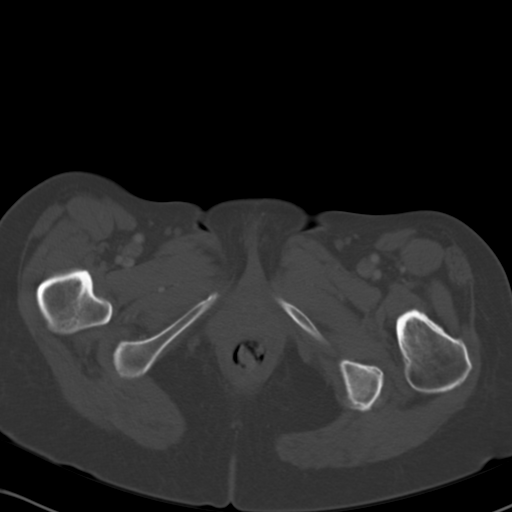
[im 12/79  soft-tissue]
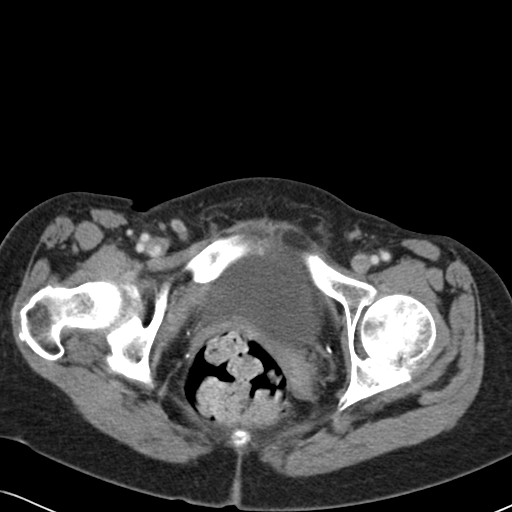
[im 16/79  soft-tissue]
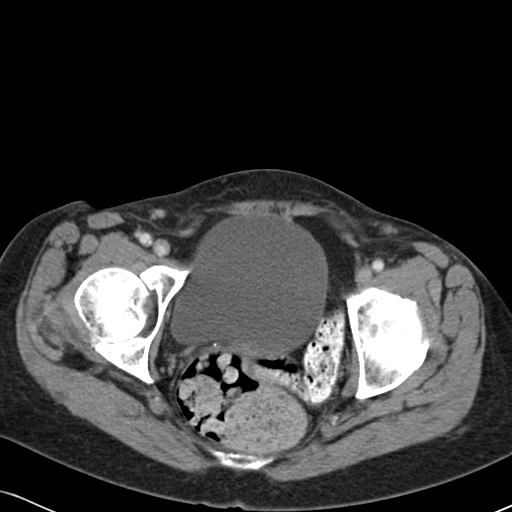
[im 24/79  soft-tissue]
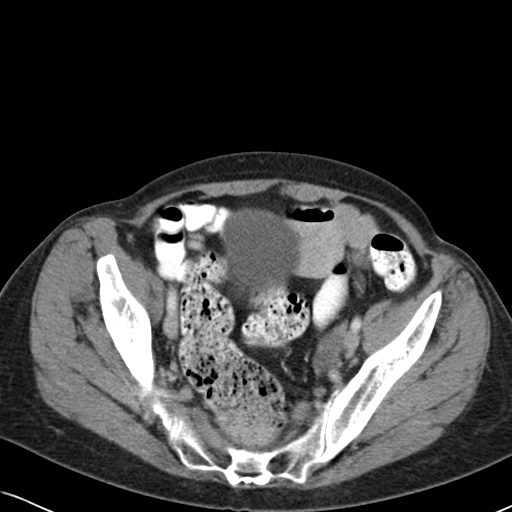
[im 28/79  soft-tissue]
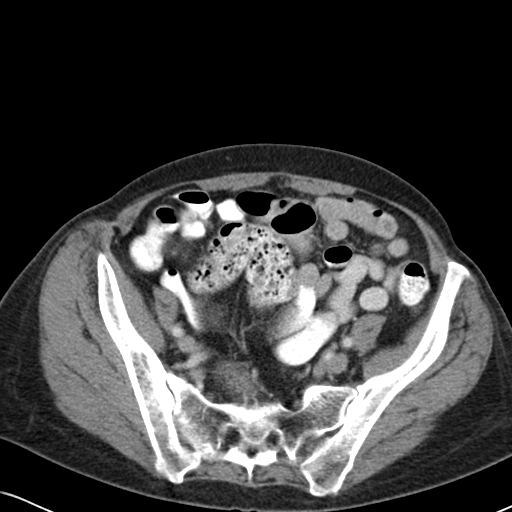
[im 36/79  soft-tissue]
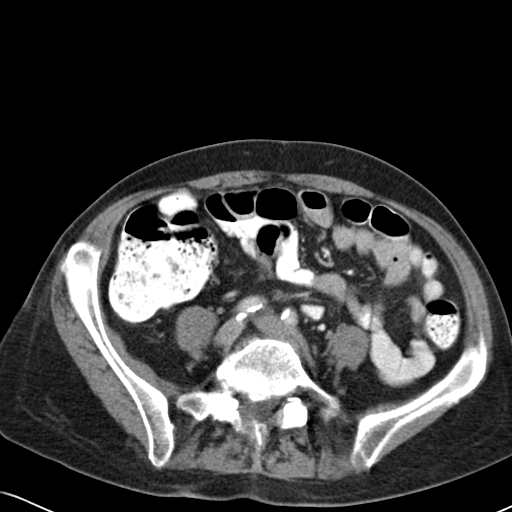
[im 40/79  soft-tissue]
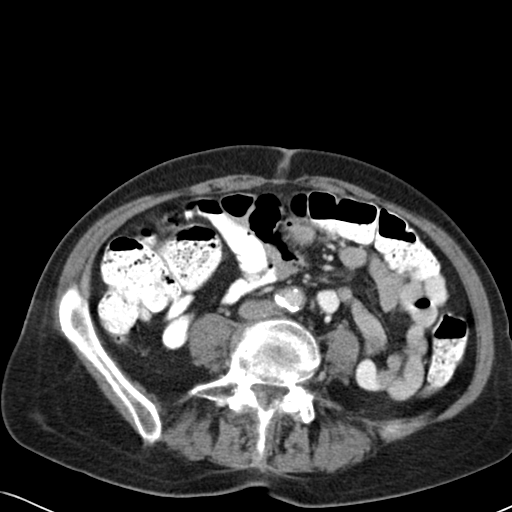
[im 43/79  soft-tissue]
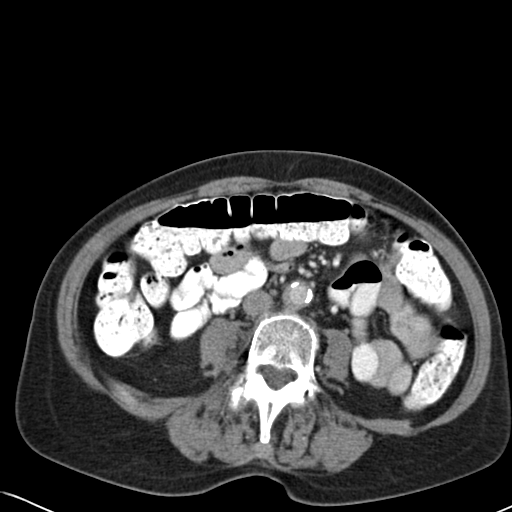
[im 51/79  soft-tissue]
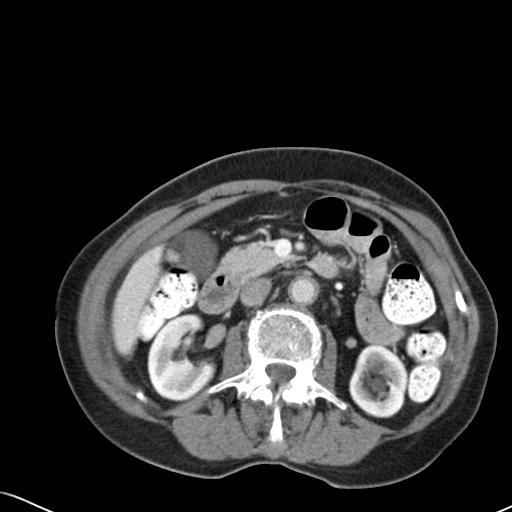
[im 51/79  bone]
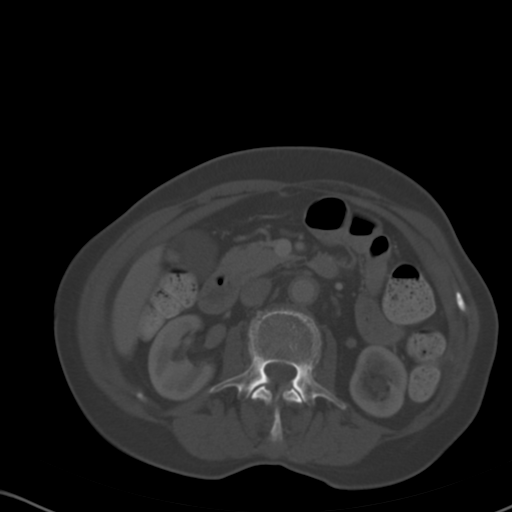
[im 55/79  soft-tissue]
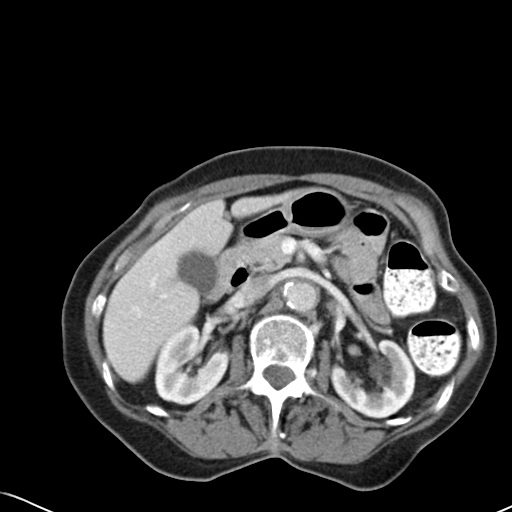
[im 63/79  soft-tissue]
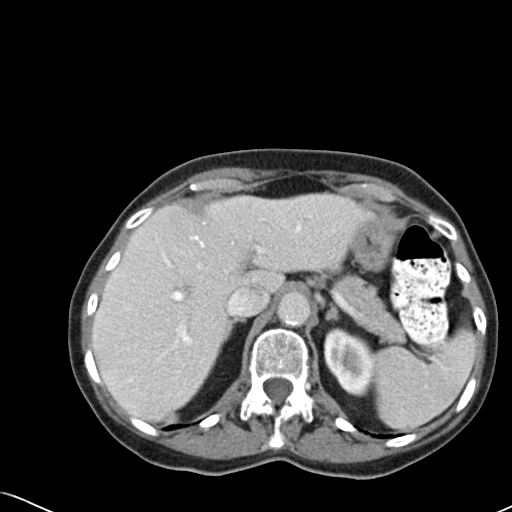
[im 67/79  soft-tissue]
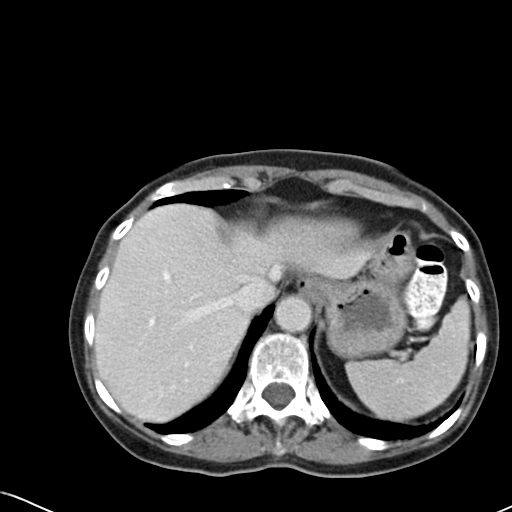
[im 75/79  soft-tissue]
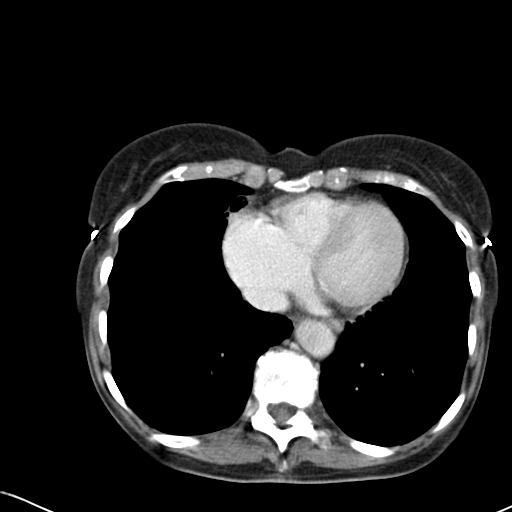

[Series 602: cor · coronal · 0.80mm/px · 3 of 86 slices shown]
[im 29/86  soft-tissue]
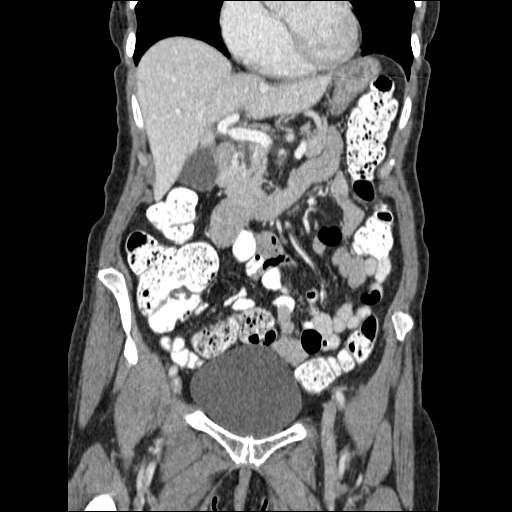
[im 38/86  soft-tissue]
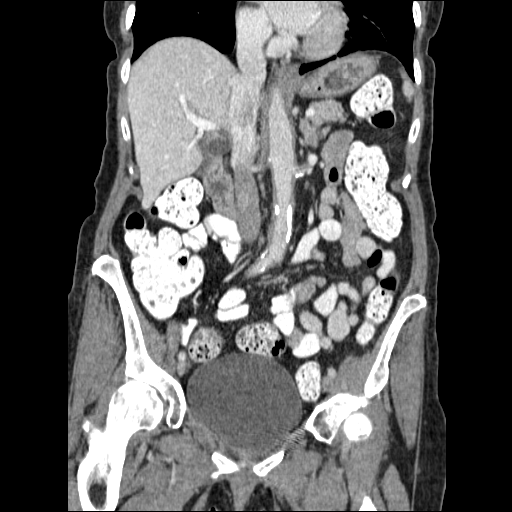
[im 48/86  soft-tissue]
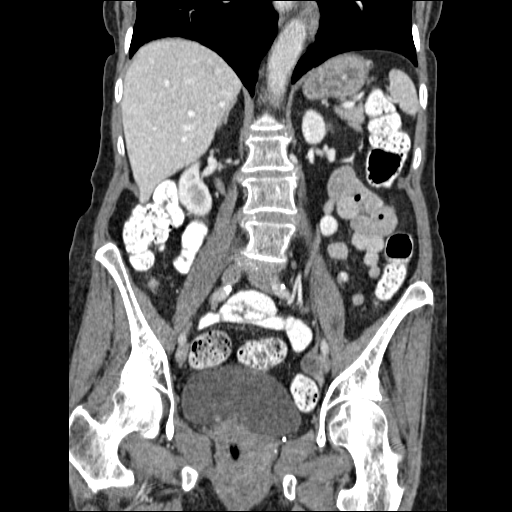

[16 of 46 positions shown; findings below may reference images not displayed]

FINDINGS: On the first image of the exam, there is a 2.2 x 1.4 cm
right breast mass in the approximate six o'clock location.  This is
not imaged in its entirety.  This most likely corresponds to a cyst
as previously imaged 09/02/2007.

Curvilinear right lower lobe atelectasis or scarring noted with
nodular areas of consolidation abutting the right hemidiaphragm
posteriorly.  No visualized pleural effusion.

Too small to characterize 3 mm hypodensity noted at the dome of the
right hepatic lobe image 80.  Adrenal glands, kidneys, pancreas,
and spleen are normal.  Liver and gallbladder are otherwise
unremarkable.  Atherosclerotic aortic calcification and hypodense
plaque noted without aneurysm or periaortic hematoma.  There are a
few areas where the peripheral aspect of the aorta is mildly
indistinct, for example image 33.  The aortic wall this area is
mildly prominent, measuring 4 mm.

Uterus presumed surgically absent.  Right ovary not visualized.
Left ovarian cyst incidentally noted, largest 1.8 cm image 58.  No
colonic or small bowel wall thickening or focal segmental
dilatation is identified.  Appendix not visualized, compatible with
prior appendectomy.  Degenerative changes are noted in the spine.
No acute osseous abnormality.
IMPRESSION: No acute intra-abdominal or pelvic pathology.

Nodular probable right lower lobe atelectasis or scarring.

Mild hypodense aortic wall thickening and minimal peripheral
indistinctness, which may be a manifestation of atherosclerotic
change, although correlate with any risk factors for possible
aortitis/vasculitis.

Probable left ovarian cyst.  Pelvic ultrasound is recommended for
further evaluation and characterization.

Probable right breast cyst as previously evaluated, although not
entirely visualized on today's exam.  The patient's most recent
mammogram at this institution is dated 12/03/2009, and she is
therefore overdue for her annual screening exam.

These results will be called to the ordering clinician or
representative by the Radiologist Assistant, and communication
documented in the PACS Dashboard.

## 2013-09-12 ENCOUNTER — Other Ambulatory Visit: Payer: Self-pay | Admitting: Internal Medicine

## 2013-11-15 ENCOUNTER — Encounter: Payer: Medicare Other | Admitting: Internal Medicine

## 2013-11-30 ENCOUNTER — Ambulatory Visit (INDEPENDENT_AMBULATORY_CARE_PROVIDER_SITE_OTHER): Payer: Medicare Other | Admitting: Internal Medicine

## 2013-11-30 ENCOUNTER — Encounter: Payer: Self-pay | Admitting: Internal Medicine

## 2013-11-30 VITALS — BP 140/86 | Temp 97.7°F | Resp 18 | Ht 60.5 in | Wt 114.0 lb

## 2013-11-30 DIAGNOSIS — M899 Disorder of bone, unspecified: Secondary | ICD-10-CM

## 2013-11-30 DIAGNOSIS — R5383 Other fatigue: Secondary | ICD-10-CM | POA: Diagnosis not present

## 2013-11-30 DIAGNOSIS — E785 Hyperlipidemia, unspecified: Secondary | ICD-10-CM

## 2013-11-30 DIAGNOSIS — H353190 Nonexudative age-related macular degeneration, unspecified eye, stage unspecified: Secondary | ICD-10-CM | POA: Diagnosis not present

## 2013-11-30 DIAGNOSIS — M949 Disorder of cartilage, unspecified: Secondary | ICD-10-CM

## 2013-11-30 DIAGNOSIS — R5381 Other malaise: Secondary | ICD-10-CM | POA: Diagnosis not present

## 2013-11-30 DIAGNOSIS — I679 Cerebrovascular disease, unspecified: Secondary | ICD-10-CM

## 2013-11-30 DIAGNOSIS — E039 Hypothyroidism, unspecified: Secondary | ICD-10-CM | POA: Diagnosis not present

## 2013-11-30 DIAGNOSIS — Z0000 Encounter for general adult medical examination without abnormal findings: Secondary | ICD-10-CM

## 2013-11-30 LAB — CBC WITH DIFFERENTIAL/PLATELET
BASOS ABS: 0 10*3/uL (ref 0.0–0.1)
Basophils Relative: 0.4 % (ref 0.0–3.0)
EOS ABS: 0.1 10*3/uL (ref 0.0–0.7)
Eosinophils Relative: 1.8 % (ref 0.0–5.0)
HEMATOCRIT: 40.1 % (ref 36.0–46.0)
Hemoglobin: 13.2 g/dL (ref 12.0–15.0)
LYMPHS ABS: 1.7 10*3/uL (ref 0.7–4.0)
Lymphocytes Relative: 20.5 % (ref 12.0–46.0)
MCHC: 33 g/dL (ref 30.0–36.0)
MCV: 86.6 fl (ref 78.0–100.0)
MONO ABS: 0.7 10*3/uL (ref 0.1–1.0)
Monocytes Relative: 8.5 % (ref 3.0–12.0)
Neutro Abs: 5.8 10*3/uL (ref 1.4–7.7)
Neutrophils Relative %: 68.8 % (ref 43.0–77.0)
PLATELETS: 264 10*3/uL (ref 150.0–400.0)
RBC: 4.63 Mil/uL (ref 3.87–5.11)
RDW: 14.6 % (ref 11.5–14.6)
WBC: 8.5 10*3/uL (ref 4.5–10.5)

## 2013-11-30 LAB — COMPREHENSIVE METABOLIC PANEL
ALK PHOS: 53 U/L (ref 39–117)
ALT: 15 U/L (ref 0–35)
AST: 24 U/L (ref 0–37)
Albumin: 4 g/dL (ref 3.5–5.2)
BILIRUBIN TOTAL: 0.5 mg/dL (ref 0.3–1.2)
BUN: 24 mg/dL — ABNORMAL HIGH (ref 6–23)
CO2: 29 meq/L (ref 19–32)
Calcium: 9 mg/dL (ref 8.4–10.5)
Chloride: 105 mEq/L (ref 96–112)
Creatinine, Ser: 0.9 mg/dL (ref 0.4–1.2)
GFR: 67.65 mL/min (ref >60.00)
Glucose, Bld: 101 mg/dL — ABNORMAL HIGH (ref 70–99)
POTASSIUM: 4.1 meq/L (ref 3.5–5.1)
SODIUM: 141 meq/L (ref 135–145)
TOTAL PROTEIN: 6.8 g/dL (ref 6.0–8.3)

## 2013-11-30 LAB — TSH: TSH: 0.82 u[IU]/mL (ref 0.35–5.50)

## 2013-11-30 MED ORDER — DIAZEPAM 2 MG PO TABS
ORAL_TABLET | ORAL | Status: DC
Start: 2013-11-30 — End: 2014-01-03

## 2013-11-30 MED ORDER — PROPRANOLOL HCL ER 80 MG PO CP24: 80.0000 mg | ORAL_CAPSULE | Freq: Every day | ORAL | Status: DC
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
## 2013-11-30 NOTE — Progress Notes (Signed)
Pre-visit discussion using our clinic review tool. No additional management support is needed unless otherwise documented below in the visit note.  

## 2013-11-30 NOTE — Patient Instructions (Addendum)
It is important that you exercise regularly, at least 20 minutes 3 to 4 times per week.  If you develop chest pain or shortness of breath seek  medical attention.  Take a calcium supplement, plus [phone] units of vitamin D  Return in 6 months for follow-up Constipation, Adult Constipation is when a person has fewer than 3 bowel movements a week; has difficulty having a bowel movement; or has stools that are dry, hard, or larger than normal. As people grow older, constipation is more common. If you try to fix constipation with medicines that make you have a bowel movement (laxatives), the problem may get worse. Long-term laxative use may cause the muscles of the colon to become weak. A low-fiber diet, not taking in enough fluids, and taking certain medicines may make constipation worse. CAUSES   Certain medicines, such as antidepressants, pain medicine, iron supplements, antacids, and water pills.   Certain diseases, such as diabetes, irritable bowel syndrome (IBS), thyroid disease, or depression.   Not drinking enough water.   Not eating enough fiber-rich foods.   Stress or travel.  Lack of physical activity or exercise.  Not going to the restroom when there is the urge to have a bowel movement.  Ignoring the urge to have a bowel movement.  Using laxatives too much. SYMPTOMS   Having fewer than 3 bowel movements a week.   Straining to have a bowel movement.   Having hard, dry, or larger than normal stools.   Feeling full or bloated.   Pain in the lower abdomen.  Not feeling relief after having a bowel movement. DIAGNOSIS  Your caregiver will take a medical history and perform a physical exam. Further testing may be done for severe constipation. Some tests may include:   A barium enema X-ray to examine your rectum, colon, and sometimes, your small intestine.  A sigmoidoscopy to examine your lower colon.  A colonoscopy to examine your entire colon. TREATMENT   Treatment will depend on the severity of your constipation and what is causing it. Some dietary treatments include drinking more fluids and eating more fiber-rich foods. Lifestyle treatments may include regular exercise. If these diet and lifestyle recommendations do not help, your caregiver may recommend taking over-the-counter laxative medicines to help you have bowel movements. Prescription medicines may be prescribed if over-the-counter medicines do not work.  HOME CARE INSTRUCTIONS   Increase dietary fiber in your diet, such as fruits, vegetables, whole grains, and beans. Limit high-fat and processed sugars in your diet, such as [place] fries, hamburgers, cookies, candies, and soda.   A fiber supplement may be added to your diet if you cannot get enough fiber from foods.   Drink enough fluids to keep your urine clear or pale yellow.   Exercise regularly or as directed by your caregiver.   Go to the restroom when you have the urge to go. Do not hold it.  Only take medicines as directed by your caregiver. Do not take other medicines for constipation without talking to your caregiver first. SEEK IMMEDIATE MEDICAL CARE IF:   You have bright red blood in your stool.   Your constipation lasts for more than 4 days or gets worse.   You have abdominal or rectal pain.   You have thin, pencil-like stools.  You have unexplained weight loss. MAKE SURE YOU:   Understand these instructions.  Will watch your condition.  Will get help right away if you are not doing well or get worse. Document Released:  04/24/2004 Document Revised: 10/19/2011 Document Reviewed: 05/08/2013 [facility] Patient Information 2014 [place], [place].

## 2013-12-02 ENCOUNTER — Other Ambulatory Visit: Payer: Self-pay | Admitting: Internal Medicine

## 2014-01-02 ENCOUNTER — Telehealth: Payer: Self-pay | Admitting: Internal Medicine

## 2014-01-02 NOTE — Telephone Encounter (Signed)
Error pt change mind   Ck

## 2014-01-03 ENCOUNTER — Encounter: Payer: Self-pay | Admitting: Internal Medicine

## 2014-01-03 ENCOUNTER — Ambulatory Visit (INDEPENDENT_AMBULATORY_CARE_PROVIDER_SITE_OTHER): Payer: Medicare Other | Admitting: Internal Medicine

## 2014-01-03 VITALS — BP 142/90 | HR 48 | Temp 97.8°F | Resp 18 | Ht 60.5 in | Wt 116.0 lb

## 2014-01-03 DIAGNOSIS — G250 Essential tremor: Secondary | ICD-10-CM | POA: Diagnosis not present

## 2014-01-03 DIAGNOSIS — R5381 Other malaise: Secondary | ICD-10-CM

## 2014-01-03 DIAGNOSIS — E039 Hypothyroidism, unspecified: Secondary | ICD-10-CM | POA: Diagnosis not present

## 2014-01-03 DIAGNOSIS — G252 Other specified forms of tremor: Secondary | ICD-10-CM

## 2014-01-03 DIAGNOSIS — R5383 Other fatigue: Secondary | ICD-10-CM | POA: Diagnosis not present

## 2014-01-03 MED ORDER — DIAZEPAM 2 MG PO TABS: ORAL_TABLET | ORAL | Status: DC

## 2014-01-03 MED ORDER — METOPROLOL SUCCINATE ER 50 MG PO TB24: 50.0000 mg | ORAL_TABLET | Freq: Every day | ORAL | Status: DC

## 2014-01-03 NOTE — Progress Notes (Signed)
Pre-visit discussion using our clinic review tool. No additional management support is needed unless otherwise documented below in the visit note.  

## 2014-01-03 NOTE — Patient Instructions (Signed)
Metoprolol 50 mg daily for 2 weeks, then try  One-half tablet daily

## 2014-01-03 NOTE — Progress Notes (Signed)
Subjective:    Patient ID: Christina Peck, female    DOB: 07/31/1927, 80 y.o.   MRN: [id number]  HPI  80 year old patient who has a long history of a essential tremor.  She has been on diazepam several times daily for control of her symptoms.  Recently, she was placed on propranolol extended release 80 mg daily.  She has had a very nice response with the tremor, but has had increasing fatigue and weakness.  Possibly also some cold intolerance  Past Medical History  Diagnosis Date  . TIA (transient ischemic attack) 1995  . Tremors of nervous system     History   Social History  . Marital Status: Married    Spouse Name: N/A    Number of Children: N/A  . Years of Education: N/A   Occupational History  . Not on file.   Social History Main Topics  . Smoking status: Former Smoker -- 0.50 packs/day for 10 years    Types: Cigarettes    Quit date: 11/11/1972  . Smokeless tobacco: Never Used  . Alcohol Use: Yes     Comment: 1 glass wine daily  . Drug Use: No  . Sexual Activity: Not on file   Other Topics Concern  . Not on file   Social History Narrative  . No narrative on file    Past Surgical History  Procedure Laterality Date  . Back surgery  1977 and 1978  . Vesicovaginal fistula closure w/ tah  1971  . Appendectomy  1967    No family history on file.  Allergies  Allergen Reactions  . Epinephrine   . Other     Septocaine per dentist    Current Outpatient Prescriptions on File Prior to Visit  Medication Sig Dispense Refill  . aspirin 81 MG tablet Take 1 tablet (81 mg total) by mouth daily. 2 daily.  30 tablet  11  . calcium-vitamin D (OSCAL WITH D 250-125) 250-125 MG-UNIT per tablet Take 1 tablet by mouth daily.        . Multiple Vitamins-Minerals (ICAPS MV PO) Take by mouth.         No current facility-administered medications on file prior to visit.    BP 142/90  Pulse 48  Temp(Src) 97.8 F (36.6 C) (Oral)  Resp 18  Ht 5' 0.5" (1.537 m)  Wt 116 lb  (52.617 kg)  BMI 22.27 kg/m2  SpO2 98%       Review of Systems  Constitutional: Positive for fatigue.  HENT: Negative for congestion, dental problem, hearing loss, rhinorrhea, sinus pressure, sore throat and tinnitus.   Eyes: Negative for pain, discharge and visual disturbance.  Respiratory: Negative for cough and shortness of breath.   Cardiovascular: Negative for chest pain, palpitations and leg swelling.  Gastrointestinal: Negative for nausea, vomiting, abdominal pain, diarrhea, constipation, blood in stool and abdominal distention.  Genitourinary: Negative for dysuria, urgency, frequency, hematuria, flank pain, vaginal bleeding, vaginal discharge, difficulty urinating, vaginal pain and pelvic pain.  Musculoskeletal: Negative for arthralgias, gait problem and joint swelling.  Skin: Negative for rash.  Neurological: Positive for weakness. Negative for dizziness, syncope, speech difficulty, numbness and headaches.  Hematological: Negative for adenopathy.  Psychiatric/Behavioral: Positive for decreased concentration. Negative for behavioral problems, dysphoric mood and agitation. The patient is not nervous/anxious.        Objective:   Physical Exam  Constitutional: She is oriented to person, place, and time. She appears well-developed and well-nourished.  HENT:  Head: Normocephalic.  Right Ear:  External ear normal.  Left Ear: External ear normal.  Mouth/Throat: Oropharynx is clear and moist.  Eyes: Conjunctivae and EOM are normal. Pupils are equal, round, and reactive to light.  Neck: Normal range of motion. Neck supple. No thyromegaly present.  Cardiovascular: Normal rate, regular rhythm, normal heart sounds and intact distal pulses.   Pulmonary/Chest: Effort normal and breath sounds normal.  Abdominal: Soft. Bowel sounds are normal. She exhibits no mass. There is no tenderness.  Musculoskeletal: Normal range of motion.  Lymphadenopathy:    She has no cervical adenopathy.    Neurological: She is alert and oriented to person, place, and time.  Skin: Skin is warm and dry. No rash noted.  Psychiatric: She has a normal mood and affect. Her behavior is normal.          Assessment & Plan:   Fatigue, bradycardia.  The generic propranolol was quite expensive.  We'll decrease to metoprolol 50 mg daily for 2 weeks and then try at a dose of 25 mg daily.  We'll attempt to taper and discontinue diazepam Recheck 4 weeks

## 2014-03-15 ENCOUNTER — Ambulatory Visit (INDEPENDENT_AMBULATORY_CARE_PROVIDER_SITE_OTHER): Payer: Medicare Other | Admitting: Internal Medicine

## 2014-03-15 ENCOUNTER — Encounter: Payer: Self-pay | Admitting: Internal Medicine

## 2014-03-15 VITALS — BP 130/90 | HR 45 | Temp 97.5°F | Resp 18 | Ht 60.5 in | Wt 114.0 lb

## 2014-03-15 DIAGNOSIS — M5412 Radiculopathy, cervical region: Secondary | ICD-10-CM | POA: Diagnosis not present

## 2014-03-15 DIAGNOSIS — M542 Cervicalgia: Secondary | ICD-10-CM | POA: Diagnosis not present

## 2014-03-15 DIAGNOSIS — M5010 Cervical disc disorder with radiculopathy, unspecified cervical region: Secondary | ICD-10-CM

## 2014-03-15 MED ORDER — METHYLPREDNISOLONE ACETATE 80 MG/ML IJ SUSP
40.0000 mg | Freq: Once | INTRAMUSCULAR | Status: AC
  Administered 2014-03-15: 40 mg via INTRAMUSCULAR

## 2014-03-15 MED ORDER — TRAMADOL HCL 50 MG PO TABS: 50.0000 mg | ORAL_TABLET | Freq: Four times a day (QID) | ORAL | Status: DC | PRN

## 2014-03-15 MED ORDER — METHOCARBAMOL 500 MG PO TABS: 500.0000 mg | ORAL_TABLET | Freq: Three times a day (TID) | ORAL | Status: DC | PRN

## 2014-03-15 NOTE — Patient Instructions (Addendum)
Cervical Collar A cervical collar is used to hold the head and neck still. This may be a soft, thick collar or a more rigid hard collar. This can keep your sore neck muscles from hurting. The collar also protects you in case a more serious injury of the neck is present and can not be seen yet on an x-ray. FOLLOW THESE INSTRUCTIONS FOR COLLAR USE:  Attach the Velcro tab in back.  Make it tight enough to support part of the weight of your chin.  Do not make it so tight that it hurts or makes it hard to breathe.  Wear it until you are comfortable without it or as instructed. HOME CARE INSTRUCTIONS   Ice packs to the neck or areas of pain approximately 03-04 times a day for 15-20 minutes while awake. Do this for 2 days. Ask your physician if you may remove the cervical collar for bathing or ice.  Do not remove any collar unless instructed by a caregiver. Ask if the collar may be removed for showering or eating.  Only take over-the-counter or prescription medicines for pain, discomfort, or fever as directed by your caregiver.  Do not drive a car until given permission by your caregiver.  Follow all instructions for follow-up with your caregiver. This includes any referrals, physical therapy, and rehabilitation. Any delay in obtaining necessary care could result in a delay or failure of the injury to heal properly. SEEK IMMEDIATE MEDICAL CARE IF:   You develop increasing pain.  You develop problems using your arms or legs or have tingling or other funny feelings in them.  You develop loss of strength in either of your arms or legs or have difficulty walking.  You develop a fever or shaking chills.  You lose control of your bowels or bladder. MAKE SURE YOU:   Understand these instructions.  Will watch your condition.  Will get help right away if you are not doing well or get worse. Document Released: 04/18/2004 Document Revised: 10/19/2011 Document Reviewed: 03/19/2008 [facility]  Patient Information 2015 [place], [place]. This information is not intended to replace advice given to you by your health care provider. Make sure you discuss any questions you have with your health care provider. Cervical Radiculopathy Cervical radiculopathy happens when a nerve in the neck is pinched or bruised by a slipped (herniated) disk or by arthritic changes in the bones of the cervical spine. This can occur due to an injury or as part of the normal aging process. Pressure on the cervical nerves can cause pain or numbness that runs from your neck all the way down into your arm and fingers. CAUSES  There are many possible causes, including:  Injury.  Muscle tightness in the neck from overuse.  Swollen, painful joints (arthritis).  Breakdown or degeneration in the bones and joints of the spine (spondylosis) due to aging.  Bone spurs that may develop near the cervical nerves. SYMPTOMS  Symptoms include pain, weakness, or numbness in the affected arm and hand. Pain can be severe or irritating. Symptoms may be worse when extending or turning the neck. DIAGNOSIS  Your caregiver will ask about your symptoms and do a physical exam. He or she may test your strength and reflexes. X-rays, CT scans, and MRI scans may be needed in cases of injury or if the symptoms do not go away after a period of time. Electromyography (EMG) or nerve conduction testing may be done to study how your nerves and muscles are working. TREATMENT  Your caregiver may recommend certain exercises to help relieve your symptoms. Cervical radiculopathy can, and often does, get better with time and treatment. If your problems continue, treatment options may include:  Wearing a soft collar for short periods of time.  Physical therapy to strengthen the neck muscles.  Medicines, such as nonsteroidal anti-inflammatory drugs (NSAIDs), oral corticosteroids, or spinal injections.  Surgery. Different types of surgery may be done  depending on the cause of your problems. HOME CARE INSTRUCTIONS   Put ice on the affected area.  Put ice in a plastic bag.  Place a towel between your skin and the bag.  Leave the ice on for 15-20 minutes, 03-04 times a day or as directed by your caregiver.  If ice does not help, you can try using heat. Take a warm shower or bath, or use a hot water bottle as directed by your caregiver.  You may try a gentle neck and shoulder massage.  Use a flat pillow when you sleep.  Only take over-the-counter or prescription medicines for pain, discomfort, or fever as directed by your caregiver.  If physical therapy was prescribed, follow your caregiver's directions.  If a soft collar was prescribed, use it as directed. SEEK IMMEDIATE MEDICAL CARE IF:   Your pain gets much worse and cannot be controlled with medicines.  You have weakness or numbness in your hand, arm, face, or leg.  You have a high fever or a stiff, rigid neck.  You lose bowel or bladder control (incontinence).  You have trouble with walking, balance, or speaking. MAKE SURE YOU:   Understand these instructions.  Will watch your condition.  Will get help right away if you are not doing well or get worse. Document Released: 04/21/2001 Document Revised: 10/19/2011 Document Reviewed: 03/10/2011 [facility] Patient Information 2015 [place], [place]. This information is not intended to replace advice given to you by your health care provider. Make sure you discuss any questions you have with your health care provider.   Aleve one tablet twice daily Tramadol as needed for pain Robaxin take one at bedtime  Continue cervical collar Use a sling for the right arm  Call if any clinical worsening

## 2014-03-15 NOTE — Progress Notes (Signed)
Pre visit review using our clinic review tool, if applicable. No additional management support is needed unless otherwise documented below in the visit note. 

## 2014-03-15 NOTE — Progress Notes (Signed)
Subjective:    Patient ID: Christina Peck, female    DOB: Nov 04, 1926, 80 y.o.   MRN: [id number]  HPI 80 year old patient who has a history of cervical degenerative disc disease.  She did have a cervical MRI 10 years ago, which was reviewed.  This revealed multilevel degenerative changes with spurring.  For the past 4-5 days.  She has had neck pain, as well as paresthesias involving the right arm.  She has been using a cervical collar.  She has attempted to contact a neurosurgeon.  Due to her pain.  Past Medical History  Diagnosis Date  . TIA (transient ischemic attack) 1995  . Tremors of nervous system     History   Social History  . Marital Status: Married    Spouse Name: N/A    Number of Children: N/A  . Years of Education: N/A   Occupational History  . Not on file.   Social History Main Topics  . Smoking status: Former Smoker -- 0.50 packs/day for 10 years    Types: Cigarettes    Quit date: 11/11/1972  . Smokeless tobacco: Never Used  . Alcohol Use: Yes     Comment: 1 glass wine daily  . Drug Use: No  . Sexual Activity: Not on file   Other Topics Concern  . Not on file   Social History Narrative  . No narrative on file    Past Surgical History  Procedure Laterality Date  . Back surgery  1977 and 1978  . Vesicovaginal fistula closure w/ tah  1971  . Appendectomy  1967    No family history on file.  Allergies  Allergen Reactions  . Epinephrine   . Other     Septocaine per dentist    Current Outpatient Prescriptions on File Prior to Visit  Medication Sig Dispense Refill  . aspirin 81 MG tablet Take 1 tablet (81 mg total) by mouth daily. 2 daily.  30 tablet  11  . calcium-vitamin D (OSCAL WITH D 250-125) 250-125 MG-UNIT per tablet Take 1 tablet by mouth daily.        . diazepam (VALIUM) 2 MG tablet TAKE 1 TABLET BY MOUTH THREE TIMES DAILY AS NEEDED  180 tablet  1  . metoprolol succinate (TOPROL XL) 50 MG 24 hr tablet Take 1 tablet (50 mg total) by mouth  daily. Take with or immediately following a meal.  60 tablet  2  . Multiple Vitamins-Minerals (ICAPS MV PO) Take by mouth.         No current facility-administered medications on file prior to visit.    BP 130/90  Pulse 45  Temp(Src) 97.5 F (36.4 C) (Oral)  Resp 18  Ht 5' 0.5" (1.537 m)  Wt 114 lb (51.71 kg)  BMI 21.89 kg/m2  SpO2 98%      Review of Systems  Constitutional: Negative.   HENT: Negative for congestion, dental problem, hearing loss, rhinorrhea, sinus pressure, sore throat and tinnitus.   Eyes: Negative for pain, discharge and visual disturbance.  Respiratory: Negative for cough and shortness of breath.   Cardiovascular: Negative for chest pain, palpitations and leg swelling.  Gastrointestinal: Negative for nausea, vomiting, abdominal pain, diarrhea, constipation, blood in stool and abdominal distention.  Genitourinary: Negative for dysuria, urgency, frequency, hematuria, flank pain, vaginal bleeding, vaginal discharge, difficulty urinating, vaginal pain and pelvic pain.  Musculoskeletal: Positive for neck pain. Negative for arthralgias, gait problem and joint swelling.  Skin: Negative for rash.  Neurological: Positive for numbness. Negative  for dizziness, syncope, speech difficulty, weakness and headaches.  Hematological: Negative for adenopathy.  Psychiatric/Behavioral: Negative for behavioral problems, dysphoric mood and agitation. The patient is not nervous/anxious.        Objective:   Physical Exam  Constitutional: She is oriented to person, place, and time. She appears well-developed and well-nourished.  HENT:  Head: Normocephalic.  Right Ear: External ear normal.  Left Ear: External ear normal.  Mouth/Throat: Oropharynx is clear and moist.  Eyes: Conjunctivae and EOM are normal. Pupils are equal, round, and reactive to light.  Neck: Normal range of motion. Neck supple. No thyromegaly present.  Cardiovascular: Normal rate, regular rhythm, normal heart  sounds and intact distal pulses.   Pulmonary/Chest: Effort normal and breath sounds normal.  Abdominal: Soft. Bowel sounds are normal. She exhibits no mass. There is no tenderness.  Musculoskeletal: Normal range of motion.  Soft cervical collar in place Biceps and triceps reflexes brisk and equal Right grip strength, possibly decreased, but more suggestive of patient effort due to discomfort  Lymphadenopathy:    She has no cervical adenopathy.  Neurological: She is alert and oriented to person, place, and time.  Skin: Skin is warm and dry. No rash noted.  Psychiatric: She has a normal mood and affect. Her behavior is normal.          Assessment & Plan:   Back pain Cervical radiculopathy  We'll treat with Depo-Medrol 40 we'll place on a bedtime dose of most relaxers as well as daytime analgesics.  We'll continue with a soft cervical collar and use a sling for the right arm.  We'll consider cervical MRI if unimproved

## 2014-03-16 ENCOUNTER — Telehealth: Payer: Self-pay | Admitting: Internal Medicine

## 2014-03-16 NOTE — Telephone Encounter (Signed)
Patient Information:  Caller Name: Christina Peck  Phone: [phone]  Patient: Christina Peck, Christina Peck  Gender: Female  DOB: 1926/12/25  Age: 80 Years  PCP: Eleonore ChiquitoKwiatkowski, Peter (Family Practice > 64yrs old)  Office Follow Up:  Does the office need to follow up with this patient?: Yes  Instructions For The Office: Please call the pt back and advise.   Symptoms  Reason For Call & Symptoms: Pt is calling because her paperwork from the pharmacy says to call the MD if she becomes nauseated on the prescribed medication. Pt was in to the office 2 days ago and was prescribed Tramadol 50mg  - pt took 1 dose at 09:30 this am and 1 dose last night. She also slept well.  It helps alot with the pain in her arm but she is nauseated and has felt like vomiting over the past hours. Pt took 1 of the muscle relaxant  Robaxin 500mg  as well  at the same time. Would it be helpful to change this medication or add an antiemetic short term?  Reviewed Health History In EMR: Yes  Reviewed Medications In EMR: Yes  Reviewed Allergies In EMR: Yes  Reviewed Surgeries / Procedures: Yes  Date of Onset of Symptoms: 03/16/2014  Guideline(s) Used:  Vomiting  Disposition Per Guideline:   Callback by PCP Today  Reason For Disposition Reached:   Vomiting a prescription medication  Advice Given:  N/A  Patient Will Follow Care Advice:  YES

## 2014-03-16 NOTE — Telephone Encounter (Signed)
Spoke to pt told her to stop the Tramadol and use Tylenol for pain as needed, but continue the muscle relaxer Robaxin per Dr. Kirtland BouchardK Pt verbalized understanding.

## 2014-03-16 NOTE — Telephone Encounter (Signed)
Discussed with Dr. Kirtland BouchardK, pt to stop Tramadol and continue Robaxin and take Tylenol for pain as needed.

## 2014-03-21 ENCOUNTER — Encounter: Payer: Self-pay | Admitting: Internal Medicine

## 2014-03-21 ENCOUNTER — Ambulatory Visit (INDEPENDENT_AMBULATORY_CARE_PROVIDER_SITE_OTHER): Payer: Medicare Other | Admitting: Internal Medicine

## 2014-03-21 VITALS — BP 150/70 | HR 48 | Temp 97.9°F | Resp 18 | Ht 60.5 in | Wt 111.0 lb

## 2014-03-21 DIAGNOSIS — M5010 Cervical disc disorder with radiculopathy, unspecified cervical region: Secondary | ICD-10-CM

## 2014-03-21 DIAGNOSIS — M5412 Radiculopathy, cervical region: Secondary | ICD-10-CM

## 2014-03-21 MED ORDER — METOPROLOL SUCCINATE ER 50 MG PO TB24: ORAL_TABLET | ORAL | Status: DC

## 2014-03-21 NOTE — Progress Notes (Signed)
   Subjective:    Patient ID: Christina Peck, female    DOB: [birth year], 80 y.o.   MRN: [id number]  HPI 80 year old patient who is seen today in followup.  She was seen about one week ago with neck pain, stiffness, decreased range of motion as well as pain and numbness involving the right arm.  She was treated with Depo-Medrol.  A soft cervical collar, as well as tramadol.  The latter has caused some nausea and she has discontinued.  She states that she is somewhat improved.  The neck pain is much better with improved range of motion.  She still has some numbness involving the right arm, especially the fourth and fifth fingers.  Denies any motor weakness  Past Medical History  Diagnosis Date  . TIA (transient ischemic attack) 1995  . Tremors of nervous system     History   Social History  . Marital Status: Married    Spouse Name: N/A    Number of Children: N/A  . Years of Education: N/A   Occupational History  . Not on file.   Social History Main Topics  . Smoking status: Former Smoker -- 0.50 packs/day for 10 years    Types: Cigarettes    Quit date: 11/11/1972  . Smokeless tobacco: Never Used  . Alcohol Use: Yes     Comment: 1 glass wine daily  . Drug Use: No  . Sexual Activity: Not on file   Other Topics Concern  . Not on file   Social History Narrative  . No narrative on file    Past Surgical History  Procedure Laterality Date  . Back surgery  1977 and 1978  . Vesicovaginal fistula closure w/ tah  1971  . Appendectomy  1967    No family history on file.  Allergies  Allergen Reactions  . Tramadol Nausea Only  . Epinephrine   . Other     Septocaine per dentist    Current Outpatient Prescriptions on File Prior to Visit  Medication Sig Dispense Refill  . aspirin 81 MG tablet Take 1 tablet (81 mg total) by mouth daily. 2 daily.  30 tablet  11  . calcium-vitamin D (OSCAL WITH D 250-125) 250-125 MG-UNIT per tablet Take 1 tablet by mouth daily.        .  diazepam (VALIUM) 2 MG tablet TAKE 1 TABLET BY MOUTH THREE TIMES DAILY AS NEEDED  180 tablet  1  . Multiple Vitamins-Minerals (ICAPS MV PO) Take by mouth.         No current facility-administered medications on file prior to visit.    BP 150/70  Pulse 48  Temp(Src) 97.9 F (36.6 C) (Oral)  Resp 18  Ht 5' 0.5" (1.537 m)  Wt 111 lb (50.349 kg)  BMI 21.31 kg/m2  SpO2 98%     Review of Systems  Musculoskeletal: Positive for neck pain.  Neurological: Positive for numbness. Negative for weakness.       Objective:   Physical Exam  Constitutional: She appears well-developed and well-nourished. No distress.  Neck:  Soft cervical collar was removed Patient had fairly intact.  Range of motion  Neurological:  Biceps and triceps reflexes remain equal and brisk  Right grip strength slightly diminished, but unclear.  Patient effort          Assessment & Plan:   Cervical radiculopathy, improved.  Intact.  Neurological exam.  Will continue our conservative treatment, unless clinical worsening

## 2014-03-21 NOTE — Patient Instructions (Signed)
Call or return to clinic prn if these symptoms worsen or fail to improve as anticipated.

## 2014-03-21 NOTE — Progress Notes (Signed)
Pre visit review using our clinic review tool, if applicable. No additional management support is needed unless otherwise documented below in the visit note. 

## 2014-05-11 ENCOUNTER — Other Ambulatory Visit: Payer: Self-pay | Admitting: Internal Medicine

## 2014-05-17 ENCOUNTER — Encounter: Payer: Self-pay | Admitting: Internal Medicine

## 2014-05-17 ENCOUNTER — Ambulatory Visit (INDEPENDENT_AMBULATORY_CARE_PROVIDER_SITE_OTHER): Payer: Medicare Other | Admitting: Internal Medicine

## 2014-05-17 VITALS — BP 102/64 | Temp 97.8°F | Ht 60.5 in | Wt 114.0 lb

## 2014-05-17 DIAGNOSIS — B9789 Other viral agents as the cause of diseases classified elsewhere: Secondary | ICD-10-CM

## 2014-05-17 DIAGNOSIS — R05 Cough: Secondary | ICD-10-CM | POA: Diagnosis not present

## 2014-05-17 DIAGNOSIS — J069 Acute upper respiratory infection, unspecified: Secondary | ICD-10-CM | POA: Diagnosis not present

## 2014-05-17 DIAGNOSIS — R059 Cough, unspecified: Secondary | ICD-10-CM

## 2014-05-17 NOTE — Progress Notes (Signed)
Pre visit review using our clinic review tool, if applicable. No additional management support is needed unless otherwise documented below in the visit note. 

## 2014-05-17 NOTE — Progress Notes (Signed)
Subjective:    Patient ID: Christina Peck, female    DOB: [birth year], 80 y.o.   MRN: [id number]  HPI 80 year old patient who is seen with a four-day history of head and chest congestion.  Chief complaint of dry, nonproductive cough.  There has been no fever.  She has had some mild headache also for the past 4 or 5 days.  She complains of some generalized weakness and muscle aches  Past Medical History  Diagnosis Date  . TIA (transient ischemic attack) 1995  . Tremors of nervous system     History   Social History  . Marital Status: Married    Spouse Name: N/A    Number of Children: N/A  . Years of Education: N/A   Occupational History  . Not on file.   Social History Main Topics  . Smoking status: Former Smoker -- 0.50 packs/day for 10 years    Types: Cigarettes    Quit date: 11/11/1972  . Smokeless tobacco: Never Used  . Alcohol Use: Yes     Comment: 1 glass wine daily  . Drug Use: No  . Sexual Activity: Not on file   Other Topics Concern  . Not on file   Social History Narrative  . No narrative on file    Past Surgical History  Procedure Laterality Date  . Back surgery  1977 and 1978  . Vesicovaginal fistula closure w/ tah  1971  . Appendectomy  1967    No family history on file.  Allergies  Allergen Reactions  . Tramadol Nausea Only  . Epinephrine   . Other     Septocaine per dentist    Current Outpatient Prescriptions on File Prior to Visit  Medication Sig Dispense Refill  . aspirin 81 MG tablet Take 1 tablet (81 mg total) by mouth daily. 2 daily.  30 tablet  11  . calcium-vitamin D (OSCAL WITH D 250-125) 250-125 MG-UNIT per tablet Take 1 tablet by mouth daily.        . diazepam (VALIUM) 2 MG tablet TAKE 1 TABLET BY MOUTH THREE TIMES DAILY AS NEEDED  180 tablet  2  . metoprolol succinate (TOPROL XL) 50 MG 24 hr tablet 1 half tablet (25 mg) daily  60 tablet  2  . Multiple Vitamins-Minerals (ICAPS MV PO) Take by mouth.         No current  facility-administered medications on file prior to visit.    BP 102/64  Temp(Src) 97.8 F (36.6 C) (Oral)  Ht 5' 0.5" (1.537 m)  Wt 114 lb (51.71 kg)  BMI 21.89 kg/m2      Review of Systems  Constitutional: Positive for activity change, appetite change and fatigue.  HENT: Negative for congestion, dental problem, hearing loss, rhinorrhea, sinus pressure, sore throat and tinnitus.   Eyes: Negative for pain, discharge and visual disturbance.  Respiratory: Positive for cough. Negative for shortness of breath.   Cardiovascular: Negative for chest pain, palpitations and leg swelling.  Gastrointestinal: Negative for nausea, vomiting, abdominal pain, diarrhea, constipation, blood in stool and abdominal distention.  Genitourinary: Negative for dysuria, urgency, frequency, hematuria, flank pain, vaginal bleeding, vaginal discharge, difficulty urinating, vaginal pain and pelvic pain.  Musculoskeletal: Positive for myalgias. Negative for arthralgias, gait problem and joint swelling.  Skin: Negative for rash.  Neurological: Negative for dizziness, syncope, speech difficulty, weakness, numbness and headaches.  Hematological: Negative for adenopathy.  Psychiatric/Behavioral: Negative for behavioral problems, dysphoric mood and agitation. The patient is not nervous/anxious.  Objective:   Physical Exam  Constitutional: She is oriented to person, place, and time. She appears well-developed and well-nourished.  HENT:  Head: Normocephalic.  Right Ear: External ear normal.  Left Ear: External ear normal.  Mouth/Throat: Oropharynx is clear and moist.  Eyes: Conjunctivae and EOM are normal. Pupils are equal, round, and reactive to light.  Neck: Normal range of motion. Neck supple. No thyromegaly present.  Cardiovascular: Normal rate, regular rhythm, normal heart sounds and intact distal pulses.   Pulmonary/Chest: Effort normal and breath sounds normal.  Abdominal: Soft. Bowel sounds are  normal. She exhibits no mass. There is no tenderness.  Musculoskeletal: Normal range of motion.  Lymphadenopathy:    She has no cervical adenopathy.  Neurological: She is alert and oriented to person, place, and time.  Skin: Skin is warm and dry. No rash noted.  Psychiatric: She has a normal mood and affect. Her behavior is normal.          Assessment & Plan:    Viral URI with cough.  We'll treat symptomatically History of chronic cough

## 2014-05-17 NOTE — Patient Instructions (Signed)
Acute bronchitis symptoms for less than 10 days are generally not helped by antibiotics.  Take over-the-counter expectorants and cough medications such as  Mucinex DM.  Call if there is no improvement in 5 to 7 days or if  you develop worsening cough, fever, or new symptoms, such as shortness of breath or chest pain.  Claritin/Alavert one daily   Use saline irrigation, warm  moist compresses.

## 2014-06-01 ENCOUNTER — Telehealth: Payer: Self-pay | Admitting: Internal Medicine

## 2014-06-01 NOTE — Telephone Encounter (Signed)
Spoke to pt, told her okay to get Flu shot as long as she does not have a fever. Occasional cough is okay. Pt verbalized understanding.

## 2014-06-01 NOTE — Telephone Encounter (Signed)
Caller: Srishti/Patient; Phone: [phone]; Reason for Call: Calling with question about flu shot.  Was seen by Dr. Kirtland BouchardK at the office on 05/17/14 for runny nose and constant cough.  She is taking Claritin and Mucinex and she feels much better but still coughs on occas.  Wants to know if it is ok for her to get a flu shot even though she is still coughing occasionally.  PLEASE ADVISE.

## 2014-06-08 DIAGNOSIS — Z23 Encounter for immunization: Secondary | ICD-10-CM | POA: Diagnosis not present

## 2014-07-09 ENCOUNTER — Telehealth: Payer: Self-pay | Admitting: Internal Medicine

## 2014-07-09 NOTE — Telephone Encounter (Signed)
Called and discussed; will taper and d/c metoprolol

## 2014-07-09 NOTE — Telephone Encounter (Signed)
Needs appt to discuss new metoprolol Rx. It is causing her terrible side effects (shaking etc) and she does not want to stop it without your direction. Please call her this morning if possible.

## 2014-07-09 NOTE — Telephone Encounter (Signed)
Please see message and advise 

## 2014-07-09 NOTE — Telephone Encounter (Signed)
Noted  

## 2014-12-04 ENCOUNTER — Encounter: Payer: Medicare Other | Admitting: Internal Medicine

## 2014-12-20 ENCOUNTER — Encounter: Payer: Self-pay | Admitting: Internal Medicine

## 2014-12-20 ENCOUNTER — Ambulatory Visit (INDEPENDENT_AMBULATORY_CARE_PROVIDER_SITE_OTHER): Payer: Medicare Other | Admitting: Internal Medicine

## 2014-12-20 VITALS — BP 130/70 | HR 59 | Temp 97.8°F | Resp 18 | Ht 60.5 in | Wt 113.0 lb

## 2014-12-20 DIAGNOSIS — Z0000 Encounter for general adult medical examination without abnormal findings: Secondary | ICD-10-CM

## 2014-12-20 DIAGNOSIS — F32A Depression, unspecified: Secondary | ICD-10-CM

## 2014-12-20 DIAGNOSIS — E034 Atrophy of thyroid (acquired): Secondary | ICD-10-CM | POA: Diagnosis not present

## 2014-12-20 DIAGNOSIS — E038 Other specified hypothyroidism: Secondary | ICD-10-CM

## 2014-12-20 DIAGNOSIS — I679 Cerebrovascular disease, unspecified: Secondary | ICD-10-CM | POA: Diagnosis not present

## 2014-12-20 DIAGNOSIS — M949 Disorder of cartilage, unspecified: Secondary | ICD-10-CM | POA: Diagnosis not present

## 2014-12-20 DIAGNOSIS — R5383 Other fatigue: Secondary | ICD-10-CM | POA: Diagnosis not present

## 2014-12-20 DIAGNOSIS — F329 Major depressive disorder, single episode, unspecified: Secondary | ICD-10-CM | POA: Diagnosis not present

## 2014-12-20 DIAGNOSIS — M899 Disorder of bone, unspecified: Secondary | ICD-10-CM

## 2014-12-20 LAB — COMPREHENSIVE METABOLIC PANEL
ALBUMIN: 4.1 g/dL (ref 3.5–5.2)
ALK PHOS: 58 U/L (ref 39–117)
ALT: 12 U/L (ref 0–35)
AST: 21 U/L (ref 0–37)
BUN: 23 mg/dL (ref 6–23)
CALCIUM: 9.5 mg/dL (ref 8.4–10.5)
CHLORIDE: 103 meq/L (ref 96–112)
CO2: 32 mEq/L (ref 19–32)
CREATININE: 0.81 mg/dL (ref 0.40–1.20)
GFR: 71.34 mL/min (ref >60.00)
GLUCOSE: 98 mg/dL (ref 70–99)
POTASSIUM: 3.9 meq/L (ref 3.5–5.1)
Sodium: 140 mEq/L (ref 135–145)
Total Bilirubin: 0.5 mg/dL (ref 0.2–1.2)
Total Protein: 7 g/dL (ref 6.0–8.3)

## 2014-12-20 LAB — CBC WITH DIFFERENTIAL/PLATELET
BASOS PCT: 0.6 % (ref 0.0–3.0)
Basophils Absolute: 0.1 10*3/uL (ref 0.0–0.1)
EOS ABS: 0.1 10*3/uL (ref 0.0–0.7)
EOS PCT: 1.4 % (ref 0.0–5.0)
HCT: 41 % (ref 36.0–46.0)
HEMOGLOBIN: 13.8 g/dL (ref 12.0–15.0)
LYMPHS PCT: 22.1 % (ref 12.0–46.0)
Lymphs Abs: 1.9 10*3/uL (ref 0.7–4.0)
MCHC: 33.7 g/dL (ref 30.0–36.0)
MCV: 84.6 fl (ref 78.0–100.0)
MONOS PCT: 8.7 % (ref 3.0–12.0)
Monocytes Absolute: 0.7 10*3/uL (ref 0.1–1.0)
NEUTROS ABS: 5.6 10*3/uL (ref 1.4–7.7)
Neutrophils Relative %: 67.2 % (ref 43.0–77.0)
Platelets: 262 10*3/uL (ref 150.0–400.0)
RBC: 4.84 Mil/uL (ref 3.87–5.11)
RDW: 14.6 % (ref 11.5–15.5)
WBC: 8.4 10*3/uL (ref 4.0–10.5)

## 2014-12-20 LAB — POCT URINALYSIS DIPSTICK
Bilirubin, UA: NEGATIVE
Glucose, UA: NEGATIVE
KETONES UA: NEGATIVE
NITRITE UA: POSITIVE
PH UA: 6
PROTEIN UA: NEGATIVE
Spec Grav, UA: 1.02
UROBILINOGEN UA: 0.2

## 2014-12-20 LAB — TSH: TSH: 0.72 u[IU]/mL (ref 0.35–4.50)

## 2014-12-20 MED ORDER — DIAZEPAM 2 MG PO TABS: 2.0000 mg | ORAL_TABLET | Freq: Three times a day (TID) | ORAL | Status: AC | PRN

## 2014-12-20 NOTE — Patient Instructions (Signed)
Call or return to clinic as needed  GOOD LUCK!

## 2014-12-20 NOTE — Progress Notes (Signed)
Pre visit review using our clinic review tool, if applicable. No additional management support is needed unless otherwise documented below in the visit note. 
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
## 2014-12-24 ENCOUNTER — Telehealth: Payer: Self-pay | Admitting: *Deleted

## 2014-12-24 NOTE — Telephone Encounter (Signed)
Dr. Kirtland BouchardK, pt called for lab results please review urinalysis and advise.

## 2014-12-24 NOTE — Telephone Encounter (Signed)
Pt called for lab results, Told her normal except urine will have Dr. Kirtland BouchardK review and call me back after 3:00 pm today. Pt verbalized understanding.

## 2014-12-24 NOTE — Telephone Encounter (Signed)
All OK  (doubt urinary abnormalities, which are mild have any significant without symptoms)

## 2014-12-25 NOTE — Telephone Encounter (Signed)
Pt called back, told her labs all okay per Dr. Kirtland BouchardK. Pt verbalized understanding.

## 2019-07-11 DEATH — deceased
